# Patient Record
Sex: Male | Born: 1950 | Race: White | Hispanic: No | Smoking: Never smoker
Health system: Southern US, Community
[De-identification: ages and names within clinical notes are randomized; demographics above are authoritative.]

## PROBLEM LIST (undated history)

## (undated) DIAGNOSIS — R972 Elevated prostate specific antigen [PSA]: Secondary | ICD-10-CM

## (undated) DIAGNOSIS — E039 Hypothyroidism, unspecified: Secondary | ICD-10-CM

## (undated) DIAGNOSIS — E785 Hyperlipidemia, unspecified: Secondary | ICD-10-CM

## (undated) DIAGNOSIS — B351 Tinea unguium: Secondary | ICD-10-CM

## (undated) DIAGNOSIS — C61 Malignant neoplasm of prostate: Secondary | ICD-10-CM

## (undated) HISTORY — PX: COLONOSCOPY W/ BIOPSIES AND POLYPECTOMY: SHX1376

## (undated) HISTORY — DX: Malignant neoplasm of prostate: C61

## (undated) HISTORY — DX: Hypothyroidism, unspecified: E03.9

## (undated) HISTORY — DX: Hyperlipidemia, unspecified: E78.5

## (undated) HISTORY — DX: Tinea unguium: B35.1

---

## 2006-04-09 ENCOUNTER — Ambulatory Visit: Payer: Self-pay | Admitting: Internal Medicine

## 2006-04-09 LAB — CONVERTED CEMR LAB
ALT: 32 units/L (ref 0–40)
AST: 24 units/L (ref 0–37)
Albumin: 4.3 g/dL (ref 3.5–5.2)
Alkaline Phosphatase: 79 units/L (ref 39–117)
BUN: 11 mg/dL (ref 6–23)
CO2: 32 meq/L (ref 19–32)
Cholesterol: 189 mg/dL (ref 0–200)
Eosinophils Absolute: 0.1 10*3/uL (ref 0.0–0.6)
GFR calc non Af Amer: 74 mL/min
HCT: 43 % (ref 39.0–52.0)
HDL: 35.1 mg/dL — ABNORMAL LOW (ref >39.0)
Hemoglobin: 15.1 g/dL (ref 13.0–17.0)
Leukocytes, UA: NEGATIVE
Monocytes Relative: 5.8 % (ref 3.0–11.0)
Neutrophils Relative %: 67.2 % (ref 43.0–77.0)
Nitrite: NEGATIVE
PSA: 1.39 ng/mL (ref 0.10–4.00)
Potassium: 4.2 meq/L (ref 3.5–5.1)
RDW: 11.8 % (ref 11.5–14.6)
Total Bilirubin: 1.5 mg/dL — ABNORMAL HIGH (ref 0.3–1.2)
Total Protein: 6.5 g/dL (ref 6.0–8.3)
Urobilinogen, UA: 0.2 (ref 0.0–1.0)
WBC: 5.9 10*3/uL (ref 4.5–10.5)

## 2006-04-14 ENCOUNTER — Ambulatory Visit: Payer: Self-pay | Admitting: Internal Medicine

## 2007-05-10 ENCOUNTER — Encounter: Payer: Self-pay | Admitting: *Deleted

## 2007-05-10 DIAGNOSIS — B351 Tinea unguium: Secondary | ICD-10-CM | POA: Insufficient documentation

## 2007-05-10 HISTORY — DX: Tinea unguium: B35.1

## 2008-02-29 ENCOUNTER — Ambulatory Visit: Payer: Self-pay | Admitting: Gastroenterology

## 2008-03-05 ENCOUNTER — Telehealth: Payer: Self-pay | Admitting: Gastroenterology

## 2008-03-07 ENCOUNTER — Ambulatory Visit: Payer: Self-pay | Admitting: Gastroenterology

## 2008-03-07 ENCOUNTER — Encounter: Payer: Self-pay | Admitting: Internal Medicine

## 2008-03-14 ENCOUNTER — Encounter: Payer: Self-pay | Admitting: Gastroenterology

## 2009-11-05 ENCOUNTER — Ambulatory Visit: Payer: Self-pay | Admitting: Internal Medicine

## 2010-08-01 NOTE — Assessment & Plan Note (Signed)
[facility]                           PRIMARY CARE OFFICE NOTE   Zachary Duncan, Zachary Duncan                    MRN:          [id number]  DATE:04/14/2006                            DOB:          September 04, 1950    [name] is a 60 year old Caucasian gentleman last seen in 2000. He  presents today for followup evaluation and exam. In the interval, he  reports he has been very healthy. He did have a toenail fungus and was  seen at [facility] and treated with Lamisil with mixed  results. He otherwise reports he is feeling well and doing well with no  active medical problems.   PAST MEDICAL HISTORY:  SURGICAL:  None.  MEDICAL:  Usual childhood diseases. The patient also had blood  poisoning. No other major medica illnesses.   CURRENT MEDICATIONS:  None.   FAMILY HISTORY:  Father died of a MI in his 60s. Mother had a MI at age  2. The patient has a brother who died of heart disease. No family  history for colon cancer or prostate cancer, diabetes, hypertension.   SOCIAL HISTORY:  The patient has one year of college. He works in  Control and instrumentation engineer. The patient was married for 9 years, divorced - now  single. He has 1 son, age 76. The patient does have a significant other  who he has been seeing for 7 years in a monogamous relationship.   HABITS:  The patient is a very occasional social drinker, averaging  maybe 4 to 5 ounces of alcohol per month. No tobacco use.   The patient has no known drug allergies.   REVIEW OF SYSTEMS:  Negative for constitutional, cardiovascular,  respiratory, GI, or GU problems.   PHYSICAL EXAMINATION:  Temperature was 96.3, blood pressure 154/82,  pulse 52, weight 194, height 6 foot.  GENERAL APPEARANCE:  Well-developed, well-nourished gentleman in no  acute distress.  HEENT EXAM:  Normocephalic, atraumatic. EACs and TMs were unremarkable.  Oropharynx with native dentition in good repair. No buccal or palatal  lesions were noted. Posterior pharynx was clear. Conjunctivae and  sclerae were clear. PERRLA. EOMI. Funduscopic exam was unremarkable.  NECK:  Was supple without thyromegaly.  NODES:  No adenopathy was noted in the cervical or supraclavicular  regions.  CHEST:  With CVA tenderness.  LUNGS:  Clear to auscultation and percussion.  CARDIOVASCULAR:  2+ radial pulses. No JVD or carotid bruits. He has a  quiet precordium with a regular rate and rhythm without murmurs, rubs,  or gallops.  ABDOMEN:  Soft. No guarding or rebound. No organosplenomegaly was  appreciated.  GENITALIA:  Normal male phallus. Bilaterally distended testicles without  masses.  RECTAL EXAM:  Normal sphincter tone was noted. Prostate was smooth and  normal size and contour without nodules.  EXTREMITIES:  Without clubbing, cyanosis, edema or deformity.  NEUROLOGICAL EXAM:  Nonfocal.  SKIN:  Clear.   DATABASE:  Hemoglobin was 15.1 g, white count was 5900 with a normal  differential. Chemistries were unremarkable with a serum glucose of 103.  Kidney function normal with a creatinine of 1.1 and a GFR  of 74 mL per  minute. Liver functions were normal. Cholesterol was 189, triglycerides  were 94, HDL 35.1, LDL 135. TSH was minimally elevated at 6.77. PSA was  normal at 1.39. Urinalysis was negative.   ASSESSMENT AND PLAN:  Health maintenance. This is a pleasant gentleman  who seems to be medically stable at this time. He does have a  significant family history for heart disease but no other cardiac risk  factors. Would recommend he follow a low fat diet and exercise on a  regular basis to bring his LDL cholesterol to goal of 130 or less.   The patient is a candidate for colorectal cancer screening given his  age. He will be referred directly to GI for colonoscopy.   SUMMARY:  This is a pleasant gentleman who seems to be medically stable  at this time. He has no active medical problems. He is asked to return  to see me  in 2 years or on an as-needed basis.     Zachary Gess Norins, MD  Electronically Signed    MEN/MedQ  DD: 04/17/2006  DT: 04/17/2006  Job #: [id number]   cc:   Bunnie Domino

## 2010-10-16 ENCOUNTER — Encounter: Payer: Self-pay | Admitting: Internal Medicine

## 2010-10-16 ENCOUNTER — Other Ambulatory Visit (INDEPENDENT_AMBULATORY_CARE_PROVIDER_SITE_OTHER): Payer: BC Managed Care – PPO

## 2010-10-16 ENCOUNTER — Ambulatory Visit (INDEPENDENT_AMBULATORY_CARE_PROVIDER_SITE_OTHER): Payer: BC Managed Care – PPO | Admitting: Internal Medicine

## 2010-10-16 VITALS — BP 132/80 | HR 47 | Temp 97.4°F | Ht 71.0 in | Wt 189.4 lb

## 2010-10-16 DIAGNOSIS — M7989 Other specified soft tissue disorders: Secondary | ICD-10-CM

## 2010-10-16 DIAGNOSIS — M25449 Effusion, unspecified hand: Secondary | ICD-10-CM

## 2010-10-16 MED ORDER — PREDNISONE 10 MG PO TABS: 10.0000 mg | ORAL_TABLET | Freq: Every day | ORAL | Status: AC

## 2010-10-16 NOTE — Patient Instructions (Addendum)
Take all new medications as prescribed Continue all other medications as before Please go to LAB in the Basement for the blood and/or urine tests to be done today Please call the phone number [phone] (the PhoneTree System) for results of testing in 2-3 days;  When calling, simply dial the number, and when prompted enter the MRN number above (the Medical Record Number) and the # key, then the message should start.  

## 2010-10-16 NOTE — Assessment & Plan Note (Addendum)
Right hand, index finger MCP - ? Gout vs DJD vs tendonitis (doubt) vs other - will hold on films today, but tx with predpack trial, check uric acid, f/u next visit with Dr Debby Bud, consider hand surgury evaluatoin

## 2010-10-16 NOTE — Progress Notes (Signed)
Quick Note:  Voice message left on PhoneTree system - lab is negative, normal or otherwise stable, pt to continue same tx ______ 

## 2010-10-19 ENCOUNTER — Encounter: Payer: Self-pay | Admitting: Internal Medicine

## 2010-10-19 DIAGNOSIS — E785 Hyperlipidemia, unspecified: Secondary | ICD-10-CM | POA: Insufficient documentation

## 2010-10-19 NOTE — Progress Notes (Signed)
  Subjective:    Patient ID: Zachary Duncan, male    DOB: 12-Nov-1950, 60 y.o.   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   Pt denies fever, wt loss, night sweats, loss of appetite, or other constitutional symptoms Past Medical History  Diagnosis Date  . Dermatophytosis of nail 05/10/2007  . Hyperlipidemia    History reviewed. No pertinent past surgical history.  reports that he has never smoked. He has never used smokeless tobacco. He reports that he drinks alcohol. He reports that he does not use illicit drugs. family history includes Heart disease in his father and mother. No Known Allergies No current outpatient prescriptions on file prior to visit.   Review of Systems Review of Systems  Constitutional: Negative for diaphoresis and unexpected weight change.  HENT: Negative for drooling and tinnitus.   Eyes: Negative for photophobia and visual disturbance.  Respiratory: Negative for choking and stridor.         Objective:   Physical Exam BP 132/80  Pulse 47  Temp(Src) 97.4 F (36.3 C) (Oral)  Ht 5\' 11"  (1.803 m)  Wt 189 lb 6 oz (85.9 kg)  BMI 26.41 kg/m2  SpO2 98% Physical Exam  VS noted Constitutional: Pt appears well-developed and well-nourished.  HENT: Head: Normocephalic.  Right Ear: External ear normal.  Left Ear: External ear normal.  Eyes: Conjunctivae and EOM are normal. Pupils are equal, round, and reactive to light.  Neck: Normal range of motion. Neck supple.  Cardiovascular: Normal rate and regular rhythm.   Pulmonary/Chest: Effort normal and breath sounds normal.   Neurological: Pt is alert. No cranial nerve deficit. motor intact to UE's Skin: Skin is warm. No erythema.  Psychiatric: Pt behavior is normal. Thought content normal.  Right hand first MCP with 1-2+ effusion with first finger swelling associated, no erythema, but mild tender and decreased ROM of MCP and finger DIP/PIP       Assessment & Plan:

## 2012-12-26 ENCOUNTER — Encounter: Payer: Self-pay | Admitting: Gastroenterology

## 2013-08-12 ENCOUNTER — Encounter: Payer: Self-pay | Admitting: Gastroenterology

## 2014-09-26 ENCOUNTER — Encounter: Payer: Self-pay | Admitting: Family

## 2014-09-26 ENCOUNTER — Telehealth: Payer: Self-pay | Admitting: Family

## 2014-09-26 ENCOUNTER — Other Ambulatory Visit (INDEPENDENT_AMBULATORY_CARE_PROVIDER_SITE_OTHER): Payer: BLUE CROSS/BLUE SHIELD

## 2014-09-26 ENCOUNTER — Ambulatory Visit (INDEPENDENT_AMBULATORY_CARE_PROVIDER_SITE_OTHER): Payer: BLUE CROSS/BLUE SHIELD | Admitting: Family

## 2014-09-26 VITALS — BP 162/94 | HR 59 | Temp 98.1°F | Resp 18 | Ht 70.5 in | Wt 185.4 lb

## 2014-09-26 DIAGNOSIS — Z0000 Encounter for general adult medical examination without abnormal findings: Secondary | ICD-10-CM | POA: Insufficient documentation

## 2014-09-26 DIAGNOSIS — R7989 Other specified abnormal findings of blood chemistry: Secondary | ICD-10-CM

## 2014-09-26 LAB — COMPREHENSIVE METABOLIC PANEL
ALT: 15 U/L (ref 0–53)
AST: 16 U/L (ref 0–37)
Albumin: 4.5 g/dL (ref 3.5–5.2)
Alkaline Phosphatase: 84 U/L (ref 39–117)
BILIRUBIN TOTAL: 1 mg/dL (ref 0.2–1.2)
BUN: 13 mg/dL (ref 6–23)
CO2: 27 mEq/L (ref 19–32)
Calcium: 9.4 mg/dL (ref 8.4–10.5)
Chloride: 107 mEq/L (ref 96–112)
Creatinine, Ser: 1.03 mg/dL (ref 0.40–1.50)
GFR: 77.3 mL/min (ref >60.00)
Glucose, Bld: 95 mg/dL (ref 70–99)
Potassium: 4.1 mEq/L (ref 3.5–5.1)
SODIUM: 143 meq/L (ref 135–145)
TOTAL PROTEIN: 6.9 g/dL (ref 6.0–8.3)

## 2014-09-26 LAB — CBC
HCT: 42.9 % (ref 39.0–52.0)
HEMOGLOBIN: 14.7 g/dL (ref 13.0–17.0)
MCHC: 34.3 g/dL (ref 30.0–36.0)
MCV: 91.5 fl (ref 78.0–100.0)
Platelets: 178 10*3/uL (ref 150.0–400.0)
RBC: 4.69 Mil/uL (ref 4.22–5.81)
RDW: 12.8 % (ref 11.5–15.5)
WBC: 6 10*3/uL (ref 4.0–10.5)

## 2014-09-26 LAB — LIPID PANEL
CHOL/HDL RATIO: 4
CHOLESTEROL: 168 mg/dL (ref 0–200)
HDL: 43 mg/dL (ref >39.00)
LDL Cholesterol: 113 mg/dL — ABNORMAL HIGH (ref 0–99)
NONHDL: 125
Triglycerides: 62 mg/dL (ref 0.0–149.0)
VLDL: 12.4 mg/dL (ref 0.0–40.0)

## 2014-09-26 LAB — PSA: PSA: 2.21 ng/mL (ref 0.10–4.00)

## 2014-09-26 LAB — TSH: TSH: 9.81 u[IU]/mL — AB (ref 0.35–4.50)

## 2014-09-26 NOTE — Assessment & Plan Note (Signed)
1) Anticipatory Guidance: Discussed importance of wearing a seatbelt while driving and not texting while driving; changing batteries in smoke detector at least once annually; wearing suntan lotion when outside; eating a balanced and moderate diet; getting physical activity at least 30 minutes per day.  2) Immunizations / Screenings / Labs:  Declines Zostavax. All other immunizations are up-to-date per recommendations. Due for a vision screen which will be scheduled independently. All other screenings are up-to-date per recommendations. Obtain CBC, CMET, Lipid profile and TSH.   Overall well exam. Patient has few cardiovascular risk factors at this time. His blood pressure slightly elevated today. Continue to monitor at this time and managed with lifestyle management. Recommended increasing physical activity to 30 minutes most days of the week or 10,000 steps per day at work. Follow-up prevention exam in 1 year. Follow-up office visit pending lab work.

## 2014-09-26 NOTE — Progress Notes (Signed)
Subjective:    Patient ID: Zachary Duncan, male    DOB: June 04, 1950, 64 y.o.   MRN: [id number]  Chief Complaint  Patient presents with  . Establish Care    CPE, fasting     HPI:  Zachary Duncan is a 64 y.o. male who presents today for an annual wellness visit.   1) Health Maintenance -   Diet - Averages 3 meals per day consisting of whole grains, chicken, vegetables, and occasional fruits. Denies caffeine intake.  Exercise - Walking around at work about 3 miles per day.    2) Preventative Exams / Immunizations:  Dental -- Up to date  Vision -- Due for exam    Health Maintenance  Topic Date Due  . HIV Screening  10/29/1965  . ZOSTAVAX  10/30/2010  . TETANUS/TDAP  09/26/2015 (Originally 10/29/1969)  . INFLUENZA VACCINE  10/15/2014  . COLONOSCOPY  03/07/2018  Declines Zostavax   There is no immunization history on file for this patient.  No Known Allergies   No outpatient prescriptions prior to visit.   No facility-administered medications prior to visit.     Past Medical History  Diagnosis Date  . Dermatophytosis of nail 05/10/2007  . Hyperlipidemia      History reviewed. No pertinent past surgical history.   Family History  Problem Relation Age of Onset  . Heart disease Mother   . Heart disease Father      History   Social History  . Marital Status: Single    Spouse Name: N/A  . Number of Children: 1  . Years of Education: 14   Occupational History  . [employer] Consultant[employer]    Social History Main Topics  . Smoking status: Never Smoker   . Smokeless tobacco: Never Used  . Alcohol Use: Yes     Comment: Socially  . Drug Use: No  . Sexual Activity: Not on file   Other Topics Concern  . Not on file   Social History Narrative   Fun: Tennis   Denies religious beliefs effecting health care.      Review of Systems  Constitutional: Denies fever, chills, fatigue, or significant weight gain/loss. HENT: Head: Denies headache or neck  pain Ears: Denies changes in hearing, ringing in ears, earache, drainage Nose: Denies discharge, stuffiness, itching, nosebleed, sinus pain Throat: Denies sore throat, hoarseness, dry mouth, sores, thrush Eyes: Denies loss/changes in vision, pain, redness, blurry/double vision, flashing lights Cardiovascular: Denies chest pain/discomfort, tightness, palpitations, shortness of breath with activity, difficulty lying down, swelling, sudden awakening with shortness of breath Respiratory: Denies shortness of breath, cough, sputum production, wheezing Gastrointestinal: Denies dysphasia, heartburn, change in appetite, nausea, change in bowel habits, rectal bleeding, constipation, diarrhea, yellow skin or eyes Genitourinary: Denies frequency, urgency, burning/pain, blood in urine, incontinence, change in urinary strength. Musculoskeletal: Denies muscle/joint pain, stiffness, back pain, redness or swelling of joints, trauma Skin: Denies rashes, lumps, itching, dryness, color changes, or hair/nail changes Toenail fungus Neurological: Denies dizziness, fainting, seizures, weakness, numbness, tingling, tremor Psychiatric - Denies nervousness, stress, depression or memory loss Endocrine: Denies heat or cold intolerance, sweating, frequent urination, excessive thirst, changes in appetite Hematologic: Denies ease of bruising or bleeding     Objective:    BP 162/94 mmHg  Pulse 59  Temp(Src) 98.1 F (36.7 C) (Oral)  Resp 18  Ht 5' 10.5" (1.791 m)  Wt 185 lb 6.4 oz (84.097 kg)  BMI 26.22 kg/m2  SpO2 97% Nursing note and vital signs reviewed.  Physical  Exam  Constitutional: He is oriented to person, place, and time. He appears well-developed and well-nourished.  HENT:  Head: Normocephalic.  Right Ear: Hearing, tympanic membrane, external ear and ear canal normal.  Left Ear: Hearing, tympanic membrane, external ear and ear canal normal.  Nose: Nose normal.  Mouth/Throat: Uvula is midline,  oropharynx is clear and moist and mucous membranes are normal.  Eyes: Conjunctivae and EOM are normal. Pupils are equal, round, and reactive to light.  Neck: Neck supple. No JVD present. No tracheal deviation present. No thyromegaly present.  Cardiovascular: Normal rate, regular rhythm, normal heart sounds and intact distal pulses.   Pulmonary/Chest: Effort normal and breath sounds normal.  Abdominal: Soft. Bowel sounds are normal. He exhibits no distension and no mass. There is no tenderness. There is no rebound and no guarding.  Musculoskeletal: Normal range of motion. He exhibits no edema or tenderness.  Lymphadenopathy:    He has no cervical adenopathy.  Neurological: He is alert and oriented to person, place, and time. He has normal reflexes. No cranial nerve deficit. He exhibits normal muscle tone. Coordination normal.  Skin: Skin is warm and dry.  Psychiatric: He has a normal mood and affect. His behavior is normal. Judgment and thought content normal.       Assessment & Plan:   Problem List Items Addressed This Visit      Other   Routine general medical examination at a health care facility - Primary    1) Anticipatory Guidance: Discussed importance of wearing a seatbelt while driving and not texting while driving; changing batteries in smoke detector at least once annually; wearing suntan lotion when outside; eating a balanced and moderate diet; getting physical activity at least 30 minutes per day.  2) Immunizations / Screenings / Labs:  Declines Zostavax. All other immunizations are up-to-date per recommendations. Due for a vision screen which will be scheduled independently. All other screenings are up-to-date per recommendations. Obtain CBC, CMET, Lipid profile and TSH.   Overall well exam. Patient has few cardiovascular risk factors at this time. His blood pressure slightly elevated today. Continue to monitor at this time and managed with lifestyle management. Recommended  increasing physical activity to 30 minutes most days of the week or 10,000 steps per day at work. Follow-up prevention exam in 1 year. Follow-up office visit pending lab work.      Relevant Orders   Comprehensive metabolic panel (Completed)   CBC (Completed)   Lipid panel (Completed)   TSH (Completed)   PSA (Completed)

## 2014-09-26 NOTE — Telephone Encounter (Signed)
Please inform patient that his blood work shows that his kidney function, electrolyte function, liver function, white/red blood cells, and prostate are all within the normal limits. His thyroid function is noted to be increased and I would like him to repeat his thyroid levels in 1 week. This is a nonfasting testing be completed at any time during the day. If confirmed, we may recommend starting thyroid medication. His cholesterol is also slightly elevated, however no medication is needed at this time. Please have him follow-up with blood work in one week.

## 2014-09-26 NOTE — Patient Instructions (Signed)
Thank you for choosing [facility].  Summary/Instructions:  Your prescription(s) have been submitted to your pharmacy or been printed and provided for you. Please take as directed and contact our office if you believe you are having problem(s) with the medication(s) or have any questions.  Please stop by the lab on the basement level of the building for your blood work. Your results will be released to [facility] (or called to you) after review, usually within 72 hours after test completion. If any changes need to be made, you will be notified at that same time.  Health Maintenance A healthy lifestyle and preventative care can promote health and wellness.  Maintain regular health, dental, and eye exams.  Eat a healthy diet. Foods like vegetables, fruits, whole grains, low-fat dairy products, and lean protein foods contain the nutrients you need and are low in calories. Decrease your intake of foods high in solid fats, added sugars, and salt. Get information about a proper diet from your health care provider, if necessary.  Regular physical exercise is one of the most important things you can do for your health. Most adults should get at least 150 minutes of moderate-intensity exercise (any activity that increases your heart rate and causes you to sweat) each week. In addition, most adults need muscle-strengthening exercises on 2 or more days a week.   Maintain a healthy weight. The body mass index (BMI) is a screening tool to identify possible weight problems. It provides an estimate of body fat based on height and weight. Your health care provider can find your BMI and can help you achieve or maintain a healthy weight. For males 20 years and older:  A BMI below 18.5 is considered underweight.  A BMI of 18.5 to 24.9 is normal.  A BMI of 25 to 29.9 is considered overweight.  A BMI of 30 and above is considered obese.  Maintain normal blood lipids and cholesterol by exercising and  minimizing your intake of saturated fat. Eat a balanced diet with plenty of fruits and vegetables. Blood tests for lipids and cholesterol should begin at age 19 and be repeated every 5 years. If your lipid or cholesterol levels are high, you are over age 34, or you are at high risk for heart disease, you may need your cholesterol levels checked more frequently.Ongoing high lipid and cholesterol levels should be treated with medicines if diet and exercise are not working.  If you smoke, find out from your health care provider how to quit. If you do not use tobacco, do not start.  Lung cancer screening is recommended for adults aged 34-80 years who are at high risk for developing lung cancer because of a history of smoking. A yearly low-dose CT scan of the lungs is recommended for people who have at least a 30-pack-year history of smoking and are current smokers or have quit within the past 15 years. A pack year of smoking is smoking an average of 1 pack of cigarettes a day for 1 year (for example, a 30-pack-year history of smoking could mean smoking 1 pack a day for 30 years or 2 packs a day for 15 years). Yearly screening should continue until the smoker has stopped smoking for at least 15 years. Yearly screening should be stopped for people who develop a health problem that would prevent them from having lung cancer treatment.  If you choose to drink alcohol, do not have more than 2 drinks per day. One drink is considered to be 12  oz (360 mL) of beer, 5 oz (150 mL) of wine, or 1.5 oz (45 mL) of liquor.  Avoid the use of street drugs. Do not share needles with anyone. Ask for help if you need support or instructions about stopping the use of drugs.  High blood pressure causes heart disease and increases the risk of stroke. Blood pressure should be checked at least every 1-2 years. Ongoing high blood pressure should be treated with medicines if weight loss and exercise are not effective.  If you are  26-15 years old, ask your health care provider if you should take aspirin to prevent heart disease.  Diabetes screening involves taking a blood sample to check your fasting blood sugar level. This should be done once every 3 years after age 37 if you are at a normal weight and without risk factors for diabetes. Testing should be considered at a younger age or be carried out more frequently if you are overweight and have at least 1 risk factor for diabetes.  Colorectal cancer can be detected and often prevented. Most routine colorectal cancer screening begins at the age of 63 and continues through age 32. However, your health care provider may recommend screening at an earlier age if you have risk factors for colon cancer. On a yearly basis, your health care provider may provide home test kits to check for hidden blood in the stool. A small camera at the end of a tube may be used to directly examine the colon (sigmoidoscopy or colonoscopy) to detect the earliest forms of colorectal cancer. Talk to your health care provider about this at age 83 when routine screening begins. A direct exam of the colon should be repeated every 5-10 years through age 70, unless early forms of precancerous polyps or small growths are found.  People who are at an increased risk for hepatitis B should be screened for this virus. You are considered at high risk for hepatitis B if:  You were born in a country where hepatitis B occurs often. Talk with your health care provider about which countries are considered high risk.  Your parents were born in a high-risk country and you have not received a shot to protect against hepatitis B (hepatitis B vaccine).  You have HIV or AIDS.  You use needles to inject street drugs.  You live with, or have sex with, someone who has hepatitis B.  You are a man who has sex with other men (MSM).  You get hemodialysis treatment.  You take certain medicines for conditions like cancer, organ  transplantation, and autoimmune conditions.  Hepatitis C blood testing is recommended for all people born from [birth year] through 1965 and any individual with known risk factors for hepatitis C.  Healthy men should no longer receive prostate-specific antigen (PSA) blood tests as part of routine cancer screening. Talk to your health care provider about prostate cancer screening.  Testicular cancer screening is not recommended for adolescents or adult males who have no symptoms. Screening includes self-exam, a health care provider exam, and other screening tests. Consult with your health care provider about any symptoms you have or any concerns you have about testicular cancer.  Practice safe sex. Use condoms and avoid high-risk sexual practices to reduce the spread of sexually transmitted infections (STIs).  You should be screened for STIs, including gonorrhea and chlamydia if:  You are sexually active and are younger than 24 years.  You are older than 24 years, and your health care provider tells  you that you are at risk for this type of infection.  Your sexual activity has changed since you were last screened, and you are at an increased risk for chlamydia or gonorrhea. Ask your health care provider if you are at risk.  If you are at risk of being infected with HIV, it is recommended that you take a prescription medicine daily to prevent HIV infection. This is called pre-exposure prophylaxis (PrEP). You are considered at risk if:  You are a man who has sex with other men (MSM).  You are a heterosexual man who is sexually active with multiple partners.  You take drugs by injection.  You are sexually active with a partner who has HIV.  Talk with your health care provider about whether you are at high risk of being infected with HIV. If you choose to begin PrEP, you should first be tested for HIV. You should then be tested every 3 months for as long as you are taking PrEP.  Use sunscreen. Apply  sunscreen liberally and repeatedly throughout the day. You should seek shade when your shadow is shorter than you. Protect yourself by wearing long sleeves, pants, a wide-brimmed hat, and sunglasses year round whenever you are outdoors.  Tell your health care provider of new moles or changes in moles, especially if there is a change in shape or color. Also, tell your health care provider if a mole is larger than the size of a pencil eraser.  A one-time screening for abdominal aortic aneurysm (AAA) and surgical repair of large AAAs by ultrasound is recommended for men aged 21-75 years who are current or former smokers.  Stay current with your vaccines (immunizations). Document Released: 08/29/2007 Document Revised: 03/07/2013 Document Reviewed: 07/28/2010 [facility] Patient Information 2015 [place], [place]. This information is not intended to replace advice given to you by your health care provider. Make sure you discuss any questions you have with your health care provider.

## 2014-09-26 NOTE — Progress Notes (Signed)
Pre visit review using our clinic review tool, if applicable. No additional management support is needed unless otherwise documented below in the visit note. 

## 2014-09-27 NOTE — Telephone Encounter (Signed)
Pt aware of results. Requested that lab work be sent to [facility].

## 2015-04-17 ENCOUNTER — Encounter: Payer: Self-pay | Admitting: Gastroenterology

## 2016-02-18 ENCOUNTER — Telehealth: Payer: Self-pay | Admitting: Family

## 2016-02-18 DIAGNOSIS — Z0000 Encounter for general adult medical examination without abnormal findings: Secondary | ICD-10-CM

## 2016-02-18 NOTE — Telephone Encounter (Signed)
Please advise 

## 2016-02-18 NOTE — Telephone Encounter (Signed)
Patient is scheduled for upcoming CPE.  Patient is requesting labs to be entered prior to appt.  Please follow up in regard.

## 2016-02-19 NOTE — Telephone Encounter (Signed)
Please determine what insurance the patient has as if he is transitioning to medicare different orders need to be placed.

## 2016-02-20 NOTE — Addendum Note (Signed)
Addended by: Mauricio Po D on: 02/20/2016 11:21 PM   Modules accepted: Orders

## 2016-02-20 NOTE — Telephone Encounter (Signed)
Lab orders placed.  

## 2016-02-20 NOTE — Telephone Encounter (Signed)
Patient states he will still have the same insurance.  Notified patient he can go to lab early.

## 2016-02-21 NOTE — Telephone Encounter (Signed)
LVM for pt letting him know orders have been placed.

## 2016-02-27 ENCOUNTER — Encounter: Payer: BLUE CROSS/BLUE SHIELD | Admitting: Family

## 2016-06-29 ENCOUNTER — Encounter: Payer: Self-pay | Admitting: Gastroenterology

## 2016-06-29 ENCOUNTER — Telehealth: Payer: Self-pay | Admitting: Family

## 2016-06-29 NOTE — Telephone Encounter (Addendum)
Patient requesting routine referral for colonoscopy to be sent to GI.   In looking at chart review, patient does not look due for routine referral.  Patient did states he had polyps found on his last colonoscopy and wanted to know if he needed to be seen sooner.  I did transfer patient to GI to get set up and go from there.

## 2016-07-15 ENCOUNTER — Encounter: Payer: Self-pay | Admitting: Family

## 2016-07-15 ENCOUNTER — Other Ambulatory Visit (INDEPENDENT_AMBULATORY_CARE_PROVIDER_SITE_OTHER): Payer: BLUE CROSS/BLUE SHIELD

## 2016-07-15 ENCOUNTER — Ambulatory Visit (INDEPENDENT_AMBULATORY_CARE_PROVIDER_SITE_OTHER): Payer: BLUE CROSS/BLUE SHIELD | Admitting: Family

## 2016-07-15 VITALS — BP 134/80 | HR 60 | Temp 98.5°F | Resp 16 | Ht 70.5 in | Wt 192.4 lb

## 2016-07-15 DIAGNOSIS — R7989 Other specified abnormal findings of blood chemistry: Secondary | ICD-10-CM | POA: Insufficient documentation

## 2016-07-15 DIAGNOSIS — Z0000 Encounter for general adult medical examination without abnormal findings: Secondary | ICD-10-CM

## 2016-07-15 DIAGNOSIS — R946 Abnormal results of thyroid function studies: Secondary | ICD-10-CM

## 2016-07-15 DIAGNOSIS — E782 Mixed hyperlipidemia: Secondary | ICD-10-CM

## 2016-07-15 LAB — LIPID PANEL
Cholesterol: 200 mg/dL (ref 0–200)
HDL: 42.2 mg/dL (ref >39.00)
NONHDL: 158.02
Total CHOL/HDL Ratio: 5
Triglycerides: 205 mg/dL — ABNORMAL HIGH (ref 0.0–149.0)
VLDL: 41 mg/dL — ABNORMAL HIGH (ref 0.0–40.0)

## 2016-07-15 LAB — CBC
HCT: 43.9 % (ref 39.0–52.0)
HEMOGLOBIN: 15.1 g/dL (ref 13.0–17.0)
MCHC: 34.4 g/dL (ref 30.0–36.0)
MCV: 92.4 fl (ref 78.0–100.0)
PLATELETS: 206 10*3/uL (ref 150.0–400.0)
RBC: 4.75 Mil/uL (ref 4.22–5.81)
RDW: 12.9 % (ref 11.5–15.5)
WBC: 6.9 10*3/uL (ref 4.0–10.5)

## 2016-07-15 LAB — COMPREHENSIVE METABOLIC PANEL
ALK PHOS: 91 U/L (ref 39–117)
ALT: 19 U/L (ref 0–53)
AST: 15 U/L (ref 0–37)
Albumin: 4.7 g/dL (ref 3.5–5.2)
BILIRUBIN TOTAL: 1.9 mg/dL — AB (ref 0.2–1.2)
BUN: 17 mg/dL (ref 6–23)
CALCIUM: 9.7 mg/dL (ref 8.4–10.5)
CO2: 30 meq/L (ref 19–32)
CREATININE: 1.05 mg/dL (ref 0.40–1.50)
Chloride: 105 mEq/L (ref 96–112)
GFR: 75.18 mL/min (ref >60.00)
GLUCOSE: 97 mg/dL (ref 70–99)
Potassium: 4.2 mEq/L (ref 3.5–5.1)
Sodium: 141 mEq/L (ref 135–145)
TOTAL PROTEIN: 6.8 g/dL (ref 6.0–8.3)

## 2016-07-15 LAB — LDL CHOLESTEROL, DIRECT: LDL DIRECT: 130 mg/dL

## 2016-07-15 LAB — TSH: TSH: 11.24 u[IU]/mL — ABNORMAL HIGH (ref 0.35–4.50)

## 2016-07-15 LAB — T4, FREE: FREE T4: 0.68 ng/dL (ref 0.60–1.60)

## 2016-07-15 LAB — PSA: PSA: 3.35 ng/mL (ref 0.10–4.00)

## 2016-07-15 NOTE — Assessment & Plan Note (Signed)
Not currently maintained on medication. Obtain lipid profile. Continue lifestyle management pending lipid profile results.

## 2016-07-15 NOTE — Assessment & Plan Note (Addendum)
1) Anticipatory Guidance: Discussed importance of wearing a seatbelt while driving and not texting while driving; changing batteries in smoke detector at least once annually; wearing suntan lotion when outside; eating a balanced and moderate diet; getting physical activity at least 30 minutes per day.  2) Immunizations / Screenings / Labs:  Declines tetanus and pneumococcal. All other immunizations are up-to-date per recommendations. Obtain hepatitis C antibody for hepatitis C screening. Obtain prostate specific antigen for prostate cancer screening. Colon cancer screening is up-to-date with appointment scheduled. All other screenings are up-to-date per recommendations. Obtain CBC, CMET, and lipid profile.    Overall well exam with risk factors for cardiovascular disease including hyperlipidemia. He is of good weight and exercises regularly through work. Risk factors will be monitored through blood work. Encouraged to continue current healthy lifestyle behaviors and choices. Follow-up prevention exam in 1 year. Up office visit for chronic conditions pending blood work as necessary.

## 2016-07-15 NOTE — Assessment & Plan Note (Signed)
Previously noted to have elevated TSH with repeat testing not performed. Obtain TSH and T4 for follow-up. Continue with lifestyle management pending TSH results.

## 2016-07-15 NOTE — Patient Instructions (Addendum)
Thank you for choosing [facility].  SUMMARY AND INSTRUCTIONS:  Please continue with the physical activity.   Continue a nutritional intake that is moderate, balanced and varied.   We will follow up on your thyroid.   Debrox or Murine for ear cleaning.    Medication:  Your prescription(s) have been submitted to your pharmacy or been printed and provided for you. Please take as directed and contact our office if you believe you are having problem(s) with the medication(s) or have any questions.  Labs:  Please stop by the lab on the lower level of the building for your blood work. Your results will be released to [facility] (or called to you) after review, usually within 72 hours after test completion. If any changes need to be made, you will be notified at that same time.  1.) The lab is open from 7:30am to 5:30 pm Monday-Friday 2.) No appointment is necessary 3.) Fasting (if needed) is 6-8 hours after food and drink; black coffee and water are okay   Follow up:  If your symptoms worsen or fail to improve, please contact our office for further instruction, or in case of emergency go directly to the emergency room at the closest medical facility.     Health Maintenance, Male A healthy lifestyle and preventive care is important for your health and wellness. Ask your health care provider about what schedule of regular examinations is right for you. What should I know about weight and diet?  Eat a Healthy Diet  Eat plenty of vegetables, fruits, whole grains, low-fat dairy products, and lean protein.  Do not eat a lot of foods high in solid fats, added sugars, or salt. Maintain a Healthy Weight  Regular exercise can help you achieve or maintain a healthy weight. You should:  Do at least 150 minutes of exercise each week. The exercise should increase your heart rate and make you sweat (moderate-intensity exercise).  Do strength-training exercises at least twice a week. Watch  Your Levels of Cholesterol and Blood Lipids  Have your blood tested for lipids and cholesterol every 5 years starting at 66 years of age. If you are at high risk for heart disease, you should start having your blood tested when you are 66 years old. You may need to have your cholesterol levels checked more often if:  Your lipid or cholesterol levels are high.  You are older than 66 years of age.  You are at high risk for heart disease. What should I know about cancer screening? Many types of cancers can be detected early and may often be prevented. Lung Cancer  You should be screened every year for lung cancer if:  You are a current smoker who has smoked for at least 30 years.  You are a former smoker who has quit within the past 15 years.  Talk to your health care provider about your screening options, when you should start screening, and how often you should be screened. Colorectal Cancer  Routine colorectal cancer screening usually begins at 66 years of age and should be repeated every 5-10 years until you are 66 years old. You may need to be screened more often if early forms of precancerous polyps or small growths are found. Your health care provider may recommend screening at an earlier age if you have risk factors for colon cancer.  Your health care provider may recommend using home test kits to check for hidden blood in the stool.  A small camera at  the end of a tube can be used to examine your colon (sigmoidoscopy or colonoscopy). This checks for the earliest forms of colorectal cancer. Prostate and Testicular Cancer  Depending on your age and overall health, your health care provider may do certain tests to screen for prostate and testicular cancer.  Talk to your health care provider about any symptoms or concerns you have about testicular or prostate cancer. Skin Cancer  Check your skin from head to toe regularly.  Tell your health care provider about any new moles or  changes in moles, especially if:  There is a change in a mole's size, shape, or color.  You have a mole that is larger than a pencil eraser.  Always use sunscreen. Apply sunscreen liberally and repeat throughout the day.  Protect yourself by wearing long sleeves, pants, a wide-brimmed hat, and sunglasses when outside. What should I know about heart disease, diabetes, and high blood pressure?  If you are 7-61 years of age, have your blood pressure checked every 3-5 years. If you are 76 years of age or older, have your blood pressure checked every year. You should have your blood pressure measured twice-once when you are at a hospital or clinic, and once when you are not at a hospital or clinic. Record the average of the two measurements. To check your blood pressure when you are not at a hospital or clinic, you can use:  An automated blood pressure machine at a pharmacy.  A home blood pressure monitor.  Talk to your health care provider about your target blood pressure.  If you are between 73-72 years old, ask your health care provider if you should take aspirin to prevent heart disease.  Have regular diabetes screenings by checking your fasting blood sugar level.  If you are at a normal weight and have a low risk for diabetes, have this test once every three years after the age of 12.  If you are overweight and have a high risk for diabetes, consider being tested at a younger age or more often.  A one-time screening for abdominal aortic aneurysm (AAA) by ultrasound is recommended for men aged 36-75 years who are current or former smokers. What should I know about preventing infection? Hepatitis B  If you have a higher risk for hepatitis B, you should be screened for this virus. Talk with your health care provider to find out if you are at risk for hepatitis B infection. Hepatitis C  Blood testing is recommended for:  Everyone born from [birth year] through 1965.  Anyone with known risk  factors for hepatitis C. Sexually Transmitted Diseases (STDs)  You should be screened each year for STDs including gonorrhea and chlamydia if:  You are sexually active and are younger than 66 years of age.  You are older than 66 years of age and your health care provider tells you that you are at risk for this type of infection.  Your sexual activity has changed since you were last screened and you are at an increased risk for chlamydia or gonorrhea. Ask your health care provider if you are at risk.  Talk with your health care provider about whether you are at high risk of being infected with HIV. Your health care provider may recommend a prescription medicine to help prevent HIV infection. What else can I do?  Schedule regular health, dental, and eye exams.  Stay current with your vaccines (immunizations).  Do not use any tobacco products, such as cigarettes, chewing  tobacco, and e-cigarettes. If you need help quitting, ask your health care provider.  Limit alcohol intake to no more than 2 drinks per day. One drink equals 12 ounces of beer, 5 ounces of wine, or 1 ounces of hard liquor.  Do not use street drugs.  Do not share needles.  Ask your health care provider for help if you need support or information about quitting drugs.  Tell your health care provider if you often feel depressed.  Tell your health care provider if you have ever been abused or do not feel safe at home. This information is not intended to replace advice given to you by your health care provider. Make sure you discuss any questions you have with your health care provider. Document Released: 08/29/2007 Document Revised: 10/30/2015 Document Reviewed: 12/04/2014 Elsevier Interactive Patient Education  2017 [employer].

## 2016-07-15 NOTE — Progress Notes (Signed)
Subjective:    Patient ID: Zachary Duncan, male    DOB: May 17, 1950, 66 y.o.   MRN: [id number]  Chief Complaint  Patient presents with  . CPE    fasting    HPI:  Zachary Duncan is a 66 y.o. male who presents today for an annual wellness visit.   1) Health Maintenance -   Diet -  Averaging about 3 meals per day consisting of a regular diet; 1-2 cups of caffeine on occasion.   Exercise - Walking around work about 3-5 miles per day; no structured exercise outside of work    2) Publishing rights manager / Immunizations:  Dental -- Up to date   Vision -- Up to date   Health Maintenance  Topic Date Due  . Hepatitis C Screening  05/13/1950  . HIV Screening  10/29/1965  . TETANUS/TDAP  07/14/2017 (Originally 10/29/1969)  . PNA vac Low Risk Adult (1 of 2 - PCV13) 07/14/2017 (Originally 10/30/2015)  . INFLUENZA VACCINE  10/14/2016  . COLONOSCOPY  03/07/2018     There is no immunization history on file for this patient.   No Known Allergies   No outpatient prescriptions prior to visit.   No facility-administered medications prior to visit.      Past Medical History:  Diagnosis Date  . Dermatophytosis of nail 05/10/2007  . Hyperlipidemia      History reviewed. No pertinent surgical history.   Family History  Problem Relation Age of Onset  . Heart disease Mother   . Heart disease Father      Social History   Social History  . Marital status: Single    Spouse name: N/A  . Number of children: 1  . Years of education: 69   Occupational History  . [employer]    Social History Main Topics  . Smoking status: Never Smoker  . Smokeless tobacco: Never Used  . Alcohol use Yes     Comment: Rarely  . Drug use: No  . Sexual activity: Not on file   Other Topics Concern  . Not on file   Social History Narrative   Fun: Tennis when able    Denies religious beliefs effecting health care.       Review of Systems  Constitutional: Denies fever,  chills, fatigue, or significant weight gain/loss. HENT: Head: Denies headache or neck pain Ears: Denies changes in hearing, ringing in ears, earache, drainage Nose: Denies discharge, stuffiness, itching, nosebleed, sinus pain Throat: Denies sore throat, hoarseness, dry mouth, sores, thrush Eyes: Denies loss/changes in vision, pain, redness, blurry/double vision, flashing lights Cardiovascular: Denies chest pain/discomfort, tightness, palpitations, shortness of breath with activity, difficulty lying down, swelling, sudden awakening with shortness of breath Respiratory: Denies shortness of breath, cough, sputum production, wheezing Gastrointestinal: Denies dysphasia, heartburn, change in appetite, nausea, change in bowel habits, rectal bleeding, constipation, diarrhea, yellow skin or eyes Genitourinary: Denies frequency, urgency, burning/pain, blood in urine, incontinence, change in urinary strength. Musculoskeletal: Denies muscle/joint pain, stiffness, back pain, redness or swelling of joints, trauma Skin: Denies rashes, lumps, itching, dryness, color changes, or hair/nail changes Neurological: Denies dizziness, fainting, seizures, weakness, numbness, tingling, tremor Psychiatric - Denies nervousness, stress, depression or memory loss Endocrine: Denies heat or cold intolerance, sweating, frequent urination, excessive thirst, changes in appetite Hematologic: Denies ease of bruising or bleeding     Objective:     BP 134/80 (BP Location: Left Arm, Patient Position: Sitting, Cuff Size: Large)   Pulse 60   Temp 98.5 F (  36.9 C) (Oral)   Resp 16   Ht 5' 10.5" (1.791 m)   Wt 192 lb 6.4 oz (87.3 kg)   SpO2 98%   BMI 27.22 kg/m  Nursing note and vital signs reviewed.  Physical Exam  Constitutional: He is oriented to person, place, and time. He appears well-developed and well-nourished.  HENT:  Head: Normocephalic.  Right Ear: Hearing, tympanic membrane, external ear and ear canal normal.    Left Ear: Hearing, tympanic membrane, external ear and ear canal normal.  Nose: Nose normal.  Mouth/Throat: Uvula is midline, oropharynx is clear and moist and mucous membranes are normal.  Eyes: Conjunctivae and EOM are normal. Pupils are equal, round, and reactive to light.  Neck: Neck supple. No JVD present. No tracheal deviation present. No thyromegaly present.  Cardiovascular: Normal rate, regular rhythm, normal heart sounds and intact distal pulses.   Pulmonary/Chest: Effort normal and breath sounds normal.  Abdominal: Soft. Bowel sounds are normal. He exhibits no distension and no mass. There is no tenderness. There is no rebound and no guarding.  Musculoskeletal: Normal range of motion. He exhibits no edema or tenderness.  Lymphadenopathy:    He has no cervical adenopathy.  Neurological: He is alert and oriented to person, place, and time. He has normal reflexes. No cranial nerve deficit. He exhibits normal muscle tone. Coordination normal.  Skin: Skin is warm and dry.  Psychiatric: He has a normal mood and affect. His behavior is normal. Judgment and thought content normal.       Assessment & Plan:   Problem List Items Addressed This Visit      Other   Hyperlipidemia    Not currently maintained on medication. Obtain lipid profile. Continue lifestyle management pending lipid profile results.      Routine general medical examination at a health care facility - Primary    1) Anticipatory Guidance: Discussed importance of wearing a seatbelt while driving and not texting while driving; changing batteries in smoke detector at least once annually; wearing suntan lotion when outside; eating a balanced and moderate diet; getting physical activity at least 30 minutes per day.  2) Immunizations / Screenings / Labs:  Declines tetanus and pneumococcal. All other immunizations are up-to-date per recommendations. Obtain hepatitis C antibody for hepatitis C screening. Obtain prostate  specific antigen for prostate cancer screening. Colon cancer screening is up-to-date with appointment scheduled. All other screenings are up-to-date per recommendations. Obtain CBC, CMET, and lipid profile.    Overall well exam with risk factors for cardiovascular disease including hyperlipidemia. He is of good weight and exercises regularly through work. Risk factors will be monitored through blood work. Encouraged to continue current healthy lifestyle behaviors and choices. Follow-up prevention exam in 1 year. Up office visit for chronic conditions pending blood work as necessary.      Relevant Orders   CBC (Completed)   Comprehensive metabolic panel (Completed)   Lipid panel (Completed)   PSA (Completed)   Hepatitis C antibody   Elevated TSH    Previously noted to have elevated TSH with repeat testing not performed. Obtain TSH and T4 for follow-up. Continue with lifestyle management pending TSH results.      Relevant Orders   TSH (Completed)   T4, free (Completed)       Mr. Lepore does not currently have medications on file.   Follow-up: Return in about 3 months (around 10/15/2016), or if symptoms worsen or fail to improve.   Mauricio Po, FNP

## 2016-07-16 ENCOUNTER — Other Ambulatory Visit: Payer: Self-pay | Admitting: Family

## 2016-07-16 ENCOUNTER — Telehealth: Payer: Self-pay | Admitting: Family

## 2016-07-16 DIAGNOSIS — R7989 Other specified abnormal findings of blood chemistry: Secondary | ICD-10-CM

## 2016-07-16 LAB — HEPATITIS C ANTIBODY: HCV Ab: NEGATIVE

## 2016-07-16 NOTE — Telephone Encounter (Signed)
Pt aware of results 

## 2016-07-16 NOTE — Telephone Encounter (Signed)
Pt would like results from yesterday. Please call back.

## 2016-07-23 ENCOUNTER — Ambulatory Visit
Admission: RE | Admit: 2016-07-23 | Discharge: 2016-07-23 | Disposition: A | Discharge status: Home / Self Care | Payer: BLUE CROSS/BLUE SHIELD | Source: Ambulatory Visit | Attending: Family | Admitting: Family

## 2016-07-23 DIAGNOSIS — R7989 Other specified abnormal findings of blood chemistry: Secondary | ICD-10-CM

## 2016-07-28 ENCOUNTER — Other Ambulatory Visit: Payer: Self-pay

## 2016-07-28 ENCOUNTER — Telehealth: Payer: Self-pay

## 2016-07-28 DIAGNOSIS — E065 Other chronic thyroiditis: Secondary | ICD-10-CM

## 2016-07-28 NOTE — Telephone Encounter (Signed)
Prescription written to be mailed.

## 2016-07-28 NOTE — Telephone Encounter (Signed)
Rx has been sent in the mail.

## 2016-07-28 NOTE — Telephone Encounter (Signed)
Please write an rx for pt to have PSA, TSH, and cholesterol rechecked. Wants this rx mailed to him so he can go to [facility] to have these rechecked.

## 2016-08-17 ENCOUNTER — Ambulatory Visit: Payer: BLUE CROSS/BLUE SHIELD

## 2016-08-17 VITALS — Ht 71.0 in | Wt 191.8 lb

## 2016-08-17 DIAGNOSIS — Z860100 Personal history of colon polyps, unspecified: Secondary | ICD-10-CM

## 2016-08-17 MED ORDER — SUPREP BOWEL PREP KIT 17.5-3.13-1.6 GM/177ML PO SOLN: 1.0000 | Freq: Once | ORAL | 0 refills | Status: AC

## 2016-08-17 NOTE — Progress Notes (Signed)
No allergies to eggs or soy No past problems with anesthesia No home oxygen No diet meds  Declined emmi 

## 2016-08-18 ENCOUNTER — Encounter: Payer: Self-pay | Admitting: Gastroenterology

## 2016-08-31 ENCOUNTER — Ambulatory Visit (AMBULATORY_SURGERY_CENTER): Payer: BLUE CROSS/BLUE SHIELD | Admitting: Gastroenterology

## 2016-08-31 ENCOUNTER — Encounter: Payer: Self-pay | Admitting: Gastroenterology

## 2016-08-31 VITALS — BP 119/62 | HR 48 | Temp 98.2°F | Resp 9 | Ht 71.0 in | Wt 191.0 lb

## 2016-08-31 DIAGNOSIS — D126 Benign neoplasm of colon, unspecified: Secondary | ICD-10-CM

## 2016-08-31 DIAGNOSIS — K635 Polyp of colon: Secondary | ICD-10-CM | POA: Diagnosis not present

## 2016-08-31 DIAGNOSIS — Z86010 Personal history of colonic polyps: Secondary | ICD-10-CM | POA: Diagnosis present

## 2016-08-31 DIAGNOSIS — D125 Benign neoplasm of sigmoid colon: Secondary | ICD-10-CM

## 2016-08-31 DIAGNOSIS — D123 Benign neoplasm of transverse colon: Secondary | ICD-10-CM

## 2016-08-31 MED ORDER — SODIUM CHLORIDE 0.9 % IV SOLN: 500.0000 mL | INTRAVENOUS | Status: DC

## 2016-08-31 NOTE — Progress Notes (Signed)
Report given to PACU, vss 

## 2016-08-31 NOTE — Progress Notes (Signed)
Pt's states no medical or surgical changes since previsit or office visit. 

## 2016-08-31 NOTE — Op Note (Signed)
Zachary Duncan Patient Name: Zachary Duncan Procedure Date: 08/31/2016 8:43 AM MRN: [id number] Endoscopist: Mallie Mussel L. Loletha Carrow , MD Age: 66 Referring MD:  Date of Birth: 1950-08-30 Gender: Male Account #: [id number] Procedure:                Colonoscopy Indications:              Surveillance: Personal history of adenomatous                            polyps on last colonoscopy > 5 years ago (64mm TVA                            02/2008) Medicines:                Monitored Anesthesia Care Procedure:                Pre-Anesthesia Assessment:                           - Prior to the procedure, a History and Physical                            was performed, and patient medications and                            allergies were reviewed. The patient's tolerance of                            previous anesthesia was also reviewed. The risks                            and benefits of the procedure and the sedation                            options and risks were discussed with the patient.                            All questions were answered, and informed consent                            was obtained. Prior Anticoagulants: The patient has                            taken no previous anticoagulant or antiplatelet                            agents. ASA Grade Assessment: I - A normal, healthy                            patient. After reviewing the risks and benefits,                            the patient was deemed in satisfactory condition to  undergo the procedure.                           After obtaining informed consent, the colonoscope                            was passed under direct vision. Throughout the                            procedure, the patient's blood pressure, pulse, and                            oxygen saturations were monitored continuously. The                            Colonoscope was introduced through the anus and                             advanced to the the cecum, identified by                            appendiceal orifice and ileocecal valve. The                            colonoscopy was performed without difficulty. The                            patient tolerated the procedure well. The quality                            of the bowel preparation was excellent. The                            ileocecal valve, appendiceal orifice, and rectum                            were photographed. The quality of the bowel                            preparation was evaluated using the BBPS [facility]                            Bowel Preparation Scale) with scores of: Right                            Colon = 3, Transverse Colon = 3 and Left Colon = 3                            (entire mucosa seen well with no residual staining,                            small fragments of stool or opaque liquid). The  total BBPS score equals 9. The bowel preparation                            used was SUPREP. Scope In: 8:50:17 AM Scope Out: 9:07:24 AM Scope Withdrawal Time: 0 hours 13 minutes 59 seconds  Total Procedure Duration: 0 hours 17 minutes 7 seconds  Findings:                 The perianal and digital rectal examinations were                            normal.                           Three sessile polyps were found in the sigmoid                            colon, proximal transverse colon and mid transverse                            colon. The polyps were 2 mm in size. These polyps                            were removed with a cold snare. Resection and                            retrieval were complete.                           The colon (entire examined portion) was moderately                            redundant.                           The exam was otherwise without abnormality on                            direct and retroflexion views. Complications:            No immediate  complications. Estimated Blood Loss:     Estimated blood loss: none. Impression:               - Three 2 mm polyps in the sigmoid colon, in the                            proximal transverse colon and in the mid transverse                            colon, removed with a cold snare. Resected and                            retrieved.                           - Redundant colon.                           -  The examination was otherwise normal on direct                            and retroflexion views. Recommendation:           - Patient has a contact number available for                            emergencies. The signs and symptoms of potential                            delayed complications were discussed with the                            patient. Return to normal activities tomorrow.                            Written discharge instructions were provided to the                            patient.                           - Resume previous diet.                           - Continue present medications.                           - Await pathology results.                           - Repeat colonoscopy is recommended for                            surveillance. The colonoscopy date will be                            determined after pathology results from today's                            exam become available for review. Traver Meckes L. Loletha Carrow, MD 08/31/2016 9:10:32 AM This report has been signed electronically.

## 2016-08-31 NOTE — Progress Notes (Signed)
No problems noted in the recovery room. maw 

## 2016-08-31 NOTE — Patient Instructions (Signed)
YOU HAD AN ENDOSCOPIC PROCEDURE TODAY AT THE [facility]:   Refer to the procedure report that was given to you for any specific questions about what was found during the examination.  If the procedure report does not answer your questions, please call your gastroenterologist to clarify.  If you requested that your care partner not be given the details of your procedure findings, then the procedure report has been included in a sealed envelope for you to review at your convenience later.  YOU SHOULD EXPECT: Some feelings of bloating in the abdomen. Passage of more gas than usual.  Walking can help get rid of the air that was put into your GI tract during the procedure and reduce the bloating. If you had a lower endoscopy (such as a colonoscopy or flexible sigmoidoscopy) you may notice spotting of blood in your stool or on the toilet paper. If you underwent a bowel prep for your procedure, you may not have a normal bowel movement for a few days.  Please Note:  You might notice some irritation and congestion in your nose or some drainage.  This is from the oxygen used during your procedure.  There is no need for concern and it should clear up in a day or so.  SYMPTOMS TO REPORT IMMEDIATELY:   Following lower endoscopy (colonoscopy or flexible sigmoidoscopy):  Excessive amounts of blood in the stool  Significant tenderness or worsening of abdominal pains  Swelling of the abdomen that is new, acute  Fever of 100F or higher   For urgent or emergent issues, a gastroenterologist can be reached at any hour by calling [phone].   DIET:  We do recommend a small meal at first, but then you may proceed to your regular diet.  Drink plenty of fluids but you should avoid alcoholic beverages for 24 hours.  ACTIVITY:  You should plan to take it easy for the rest of today and you should NOT DRIVE or use heavy machinery until tomorrow (because of the sedation medicines used during the test).     FOLLOW UP: Our staff will call the number listed on your records the next business day following your procedure to check on you and address any questions or concerns that you may have regarding the information given to you following your procedure. If we do not reach you, we will leave a message.  However, if you are feeling well and you are not experiencing any problems, there is no need to return our call.  We will assume that you have returned to your regular daily activities without incident.  If any biopsies were taken you will be contacted by phone or by letter within the next 1-3 weeks.  Please call us at [phone] if you have not heard about the biopsies in 3 weeks.    SIGNATURES/CONFIDENTIALITY: You and/or your care partner have signed paperwork which will be entered into your electronic medical record.  These signatures attest to the fact that that the information above on your After Visit Summary has been reviewed and is understood.  Full responsibility of the confidentiality of this discharge information lies with you and/or your care-partner.   Handout was given to your care partner on polyps. You may resume your current medications today. Await biopsy results. Please call if any questions or concerns.   

## 2016-09-01 ENCOUNTER — Telehealth: Payer: Self-pay

## 2016-09-01 NOTE — Telephone Encounter (Signed)
  Follow up Call-  Call back number 08/31/2016  Post procedure Call Back phone  # [phone]  Permission to leave phone message Yes  Some recent data might be hidden     Patient questions:  Do you have a fever, pain , or abdominal swelling? No. Pain Score  0 *  Have you tolerated food without any problems? Yes.    Have you been able to return to your normal activities? Yes.    Do you have any questions about your discharge instructions: Diet   No. Medications  No. Follow up visit  No.  Do you have questions or concerns about your Care? No.  Actions: * If pain score is 4 or above: No action needed, pain <4.  No problems noted per pt. maw

## 2016-09-04 ENCOUNTER — Encounter: Payer: Self-pay | Admitting: Gastroenterology

## 2016-09-23 ENCOUNTER — Encounter: Payer: Self-pay | Admitting: Endocrinology

## 2016-09-23 ENCOUNTER — Ambulatory Visit (INDEPENDENT_AMBULATORY_CARE_PROVIDER_SITE_OTHER): Payer: BLUE CROSS/BLUE SHIELD | Admitting: Endocrinology

## 2016-09-23 VITALS — BP 132/84 | HR 52 | Ht 70.5 in | Wt 194.0 lb

## 2016-09-23 DIAGNOSIS — R946 Abnormal results of thyroid function studies: Secondary | ICD-10-CM | POA: Diagnosis not present

## 2016-09-23 DIAGNOSIS — R7989 Other specified abnormal findings of blood chemistry: Secondary | ICD-10-CM

## 2016-09-23 MED ORDER — LEVOTHYROXINE SODIUM 50 MCG PO TABS: 50.0000 ug | ORAL_TABLET | Freq: Every day | ORAL | 3 refills | Status: DC

## 2016-09-23 NOTE — Patient Instructions (Addendum)
I have sent a prescription to your pharmacy, for the thyroid.   Please come back to do the blood tests in 1 month.   I would be happy to see you back here as needed.        Hypothyroidism Hypothyroidism is a disorder of the thyroid. The thyroid is a large gland that is located in the lower front of the neck. The thyroid releases hormones that control how the body works. With hypothyroidism, the thyroid does not make enough of these hormones. What are the causes? Causes of hypothyroidism may include:  Viral infections.  Pregnancy.  Your own defense system (immune system) attacking your thyroid.  Certain medicines.  Birth defects.  Past radiation treatments to your head or neck.  Past treatment with radioactive iodine.  Past surgical removal of part or all of your thyroid.  Problems with the gland that is located in the center of your brain (pituitary).  What are the signs or symptoms? Signs and symptoms of hypothyroidism may include:  Feeling as though you have no energy (lethargy).  Inability to tolerate cold.  Weight gain that is not explained by a change in diet or exercise habits.  Dry skin.  Coarse hair.  Menstrual irregularity.  Slowing of thought processes.  Constipation.  Sadness or depression.  How is this diagnosed? Your health care provider may diagnose hypothyroidism with blood tests and ultrasound tests. How is this treated? Hypothyroidism is treated with medicine that replaces the hormones that your body does not make. After you begin treatment, it may take several weeks for symptoms to go away. Follow these instructions at home:  Take medicines only as directed by your health care provider.  If you start taking any new medicines, tell your health care provider.  Keep all follow-up visits as directed by your health care provider. This is important. As your condition improves, your dosage needs may change. You will need to have blood tests  regularly so that your health care provider can watch your condition. Contact a health care provider if:  Your symptoms do not get better with treatment.  You are taking thyroid replacement medicine and: ? You sweat excessively. ? You have tremors. ? You feel anxious. ? You lose weight rapidly. ? You cannot tolerate heat. ? You have emotional swings. ? You have diarrhea. ? You feel weak. Get help right away if:  You develop chest pain.  You develop an irregular heartbeat.  You develop a rapid heartbeat. This information is not intended to replace advice given to you by your health care provider. Make sure you discuss any questions you have with your health care provider. Document Released: 03/02/2005 Document Revised: 08/08/2015 Document Reviewed: 07/18/2013 Elsevier Interactive Patient Education  2017 [employer].

## 2016-09-23 NOTE — Progress Notes (Signed)
Subjective:    Patient ID: Zachary Duncan, male    DOB: 09/14/1950, 66 y.o.   MRN: [id number]  HPI Pt is referred by Terri Piedra, NP,for hypothyroidism.  hypothyroidism was dx'ed in 2008.  He has never been on prescribed thyroid hormone therapy.  He has never taken kelp or any other type of non-prescribed thyroid product.  He has never had thyroid surgery, or XRT to the neck.  He has never been on amiodarone or lithium.  He reports slight dry skin, and assoc easy bruising.  Past Medical History:  Diagnosis Date  . Dermatophytosis of nail 05/10/2007  . Hyperlipidemia     Past Surgical History:  Procedure Laterality Date  . COLONOSCOPY W/ BIOPSIES AND POLYPECTOMY      Social History   Social History  . Marital status: Divorced    Spouse name: N/A  . Number of children: 1  . Years of education: 85   Occupational History  . [employer]    Social History Main Topics  . Smoking status: Never Smoker  . Smokeless tobacco: Never Used  . Alcohol use Yes     Comment: 2-3 monthly  . Drug use: No  . Sexual activity: Not on file   Other Topics Concern  . Not on file   Social History Narrative   Fun: Tennis when able    Denies religious beliefs effecting health care.     No current outpatient prescriptions on file prior to visit.   Current Facility-Administered Medications on File Prior to Visit  Medication Dose Route Frequency Provider Last Rate Last Dose  . 0.9 %  sodium chloride infusion  500 mL Intravenous Continuous Danis, Estill Cotta III, MD        No Known Allergies  Family History  Problem Relation Age of Onset  . Heart disease Mother   . Heart disease Father   . Colon cancer Neg Hx   . Thyroid disease Neg Hx     BP 132/84   Pulse (!) 52   Ht 5' 10.5" (1.791 m)   Wt 194 lb (88 kg)   SpO2 98%   BMI 27.44 kg/m    Review of Systems denies depression, muscle cramps, sob, weight gain, memory loss, constipation, numbness, diplopia, cold intolerance,  myalgias, rhinorrhea, and syncope.        Objective:   Physical Exam VS: see vs page GEN: no distress HEAD: head: no deformity eyes: no periorbital swelling, no proptosis external nose and ears are normal mouth: no lesion seen NECK: supple, thyroid is not enlarged CHEST WALL: no deformity LUNGS: clear to auscultation CV: reg rate and rhythm, no murmur ABD: abdomen is soft, nontender.  no hepatosplenomegaly.  not distended.  no hernia MUSCULOSKELETAL: muscle bulk and strength are grossly normal.  no obvious joint swelling.  gait is normal and steady EXTEMITIES: no deformity.  no edema PULSES: no carotid bruit NEURO:  cn 2-12 grossly intact.   readily moves all 4's.  sensation is intact to touch on all 4's SKIN:  Normal texture and temperature.  No rash or suspicious lesion is visible.   NODES:  None palpable at the neck PSYCH: alert, well-oriented.  Does not appear anxious nor depressed.  [place]: Mildly heterogeneous thyroid gland suggesting chronic thyroiditis.  An echogenic nodule in the left superior gland is most consistent with a focus of active thyroiditis.   Lab Results  Component Value Date   TSH 11.24 (H) 07/15/2016   I have reviewed outside  records, and summarized: Pt was noted to have elevated TSH, and referred here.  rx was advised, but pt was hesitant to take.      Assessment & Plan:  Hypothyroidism, new Bradycardia: although this could be physiologic, this would be considered an indication for synthroid.  We discussed, and pt agrees.     Patient Instructions  I have sent a prescription to your pharmacy, for the thyroid.   Please come back to do the blood tests in 1 month.   I would be happy to see you back here as needed.        Hypothyroidism Hypothyroidism is a disorder of the thyroid. The thyroid is a large gland that is located in the lower front of the neck. The thyroid releases hormones that control how the body works. With hypothyroidism, the thyroid  does not make enough of these hormones. What are the causes? Causes of hypothyroidism may include:  Viral infections.  Pregnancy.  Your own defense system (immune system) attacking your thyroid.  Certain medicines.  Birth defects.  Past radiation treatments to your head or neck.  Past treatment with radioactive iodine.  Past surgical removal of part or all of your thyroid.  Problems with the gland that is located in the center of your brain (pituitary).  What are the signs or symptoms? Signs and symptoms of hypothyroidism may include:  Feeling as though you have no energy (lethargy).  Inability to tolerate cold.  Weight gain that is not explained by a change in diet or exercise habits.  Dry skin.  Coarse hair.  Menstrual irregularity.  Slowing of thought processes.  Constipation.  Sadness or depression.  How is this diagnosed? Your health care provider may diagnose hypothyroidism with blood tests and ultrasound tests. How is this treated? Hypothyroidism is treated with medicine that replaces the hormones that your body does not make. After you begin treatment, it may take several weeks for symptoms to go away. Follow these instructions at home:  Take medicines only as directed by your health care provider.  If you start taking any new medicines, tell your health care provider.  Keep all follow-up visits as directed by your health care provider. This is important. As your condition improves, your dosage needs may change. You will need to have blood tests regularly so that your health care provider can watch your condition. Contact a health care provider if:  Your symptoms do not get better with treatment.  You are taking thyroid replacement medicine and: ? You sweat excessively. ? You have tremors. ? You feel anxious. ? You lose weight rapidly. ? You cannot tolerate heat. ? You have emotional swings. ? You have diarrhea. ? You feel weak. Get help  right away if:  You develop chest pain.  You develop an irregular heartbeat.  You develop a rapid heartbeat. This information is not intended to replace advice given to you by your health care provider. Make sure you discuss any questions you have with your health care provider. Document Released: 03/02/2005 Document Revised: 08/08/2015 Document Reviewed: 07/18/2013 Elsevier Interactive Patient Education  2017 [employer].

## 2017-07-20 ENCOUNTER — Encounter: Payer: BLUE CROSS/BLUE SHIELD | Admitting: Family

## 2017-08-17 ENCOUNTER — Ambulatory Visit (INDEPENDENT_AMBULATORY_CARE_PROVIDER_SITE_OTHER): Payer: BLUE CROSS/BLUE SHIELD | Admitting: Family Medicine

## 2017-08-17 ENCOUNTER — Encounter: Payer: Self-pay | Admitting: Family Medicine

## 2017-08-17 VITALS — BP 128/80 | HR 54 | Ht 70.5 in | Wt 191.4 lb

## 2017-08-17 DIAGNOSIS — R972 Elevated prostate specific antigen [PSA]: Secondary | ICD-10-CM | POA: Diagnosis not present

## 2017-08-17 DIAGNOSIS — Z0001 Encounter for general adult medical examination with abnormal findings: Secondary | ICD-10-CM | POA: Diagnosis not present

## 2017-08-17 DIAGNOSIS — H6123 Impacted cerumen, bilateral: Secondary | ICD-10-CM

## 2017-08-17 DIAGNOSIS — E039 Hypothyroidism, unspecified: Secondary | ICD-10-CM | POA: Diagnosis not present

## 2017-08-17 LAB — URINALYSIS, ROUTINE W REFLEX MICROSCOPIC
BILIRUBIN URINE: NEGATIVE
Hgb urine dipstick: NEGATIVE
KETONES UR: NEGATIVE
Leukocytes, UA: NEGATIVE
NITRITE: NEGATIVE
RBC / HPF: NONE SEEN (ref 0–?)
Total Protein, Urine: NEGATIVE
URINE GLUCOSE: NEGATIVE
UROBILINOGEN UA: 0.2 (ref 0.0–1.0)
pH: 5.5 (ref 5.0–8.0)

## 2017-08-17 LAB — LIPID PANEL
CHOLESTEROL: 183 mg/dL (ref 0–200)
HDL: 33.6 mg/dL — ABNORMAL LOW (ref >39.00)
LDL Cholesterol: 125 mg/dL — ABNORMAL HIGH (ref 0–99)
NonHDL: 149.7
TRIGLYCERIDES: 126 mg/dL (ref 0.0–149.0)
Total CHOL/HDL Ratio: 5
VLDL: 25.2 mg/dL (ref 0.0–40.0)

## 2017-08-17 LAB — COMPREHENSIVE METABOLIC PANEL
ALK PHOS: 87 U/L (ref 39–117)
ALT: 14 U/L (ref 0–53)
AST: 13 U/L (ref 0–37)
Albumin: 4.6 g/dL (ref 3.5–5.2)
BILIRUBIN TOTAL: 2 mg/dL — AB (ref 0.2–1.2)
BUN: 15 mg/dL (ref 6–23)
CO2: 29 meq/L (ref 19–32)
CREATININE: 0.96 mg/dL (ref 0.40–1.50)
Calcium: 9.4 mg/dL (ref 8.4–10.5)
Chloride: 105 mEq/L (ref 96–112)
GFR: 83.09 mL/min (ref >60.00)
GLUCOSE: 98 mg/dL (ref 70–99)
Potassium: 4.2 mEq/L (ref 3.5–5.1)
Sodium: 141 mEq/L (ref 135–145)
TOTAL PROTEIN: 6.4 g/dL (ref 6.0–8.3)

## 2017-08-17 LAB — PSA: PSA: 3.65 ng/mL (ref 0.10–4.00)

## 2017-08-17 LAB — CBC
HCT: 43 % (ref 39.0–52.0)
Hemoglobin: 14.9 g/dL (ref 13.0–17.0)
MCHC: 34.7 g/dL (ref 30.0–36.0)
MCV: 91.3 fl (ref 78.0–100.0)
Platelets: 181 10*3/uL (ref 150.0–400.0)
RBC: 4.7 Mil/uL (ref 4.22–5.81)
RDW: 13.4 % (ref 11.5–15.5)
WBC: 5.7 10*3/uL (ref 4.0–10.5)

## 2017-08-17 LAB — TSH: TSH: 9.37 u[IU]/mL — AB (ref 0.35–4.50)

## 2017-08-17 MED ORDER — EAR WAX CLEANSING 6.5 % OT KIT: PACK | OTIC | 1 refills | Status: DC

## 2017-08-17 MED ORDER — LEVOTHYROXINE SODIUM 50 MCG PO TABS: ORAL_TABLET | ORAL | 0 refills | Status: DC

## 2017-08-17 NOTE — Patient Instructions (Signed)
Earwax Buildup, Adult The ears produce a substance called earwax that helps keep bacteria out of the ear and protects the skin in the ear canal. Occasionally, earwax can build up in the ear and cause discomfort or hearing loss. What increases the risk? This condition is more likely to develop in people who:  Are male.  Are elderly.  Naturally produce more earwax.  Clean their ears often with cotton swabs.  Use earplugs often.  Use in-ear headphones often.  Wear hearing aids.  Have narrow ear canals.  Have earwax that is overly thick or sticky.  Have eczema.  Are dehydrated.  Have excess hair in the ear canal.  What are the signs or symptoms? Symptoms of this condition include:  Reduced or muffled hearing.  A feeling of fullness in the ear or feeling that the ear is plugged.  Fluid coming from the ear.  Ear pain.  Ear itch.  Ringing in the ear.  Coughing.  An obvious piece of earwax that can be seen inside the ear canal.  How is this diagnosed? This condition may be diagnosed based on:  Your symptoms.  Your medical history.  An ear exam. During the exam, your health care provider will look into your ear with an instrument called an otoscope.  You may have tests, including a hearing test. How is this treated? This condition may be treated by:  Using ear drops to soften the earwax.  Having the earwax removed by a health care provider. The health care provider may: ? Flush the ear with water. ? Use an instrument that has a loop on the end (curette). ? Use a suction device.  Surgery to remove the wax buildup. This may be done in severe cases.  Follow these instructions at home:  Take over-the-counter and prescription medicines only as told by your health care provider.  Do not put any objects, including cotton swabs, into your ear. You can clean the opening of your ear canal with a washcloth or facial tissue.  Follow instructions from your health  care provider about cleaning your ears. Do not over-clean your ears.  Drink enough fluid to keep your urine clear or pale yellow. This will help to thin the earwax.  Keep all follow-up visits as told by your health care provider. If earwax builds up in your ears often or if you use hearing aids, consider seeing your health care provider for routine, preventive ear cleanings. Ask your health care provider how often you should schedule your cleanings.  If you have hearing aids, clean them according to instructions from the manufacturer and your health care provider. Contact a health care provider if:  You have ear pain.  You develop a fever.  You have blood, pus, or other fluid coming from your ear.  You have hearing loss.  You have ringing in your ears that does not go away.  Your symptoms do not improve with treatment.  You feel like the room is spinning (vertigo). Summary  Earwax can build up in the ear and cause discomfort or hearing loss.  The most common symptoms of this condition include reduced or muffled hearing and a feeling of fullness in the ear or feeling that the ear is plugged.  This condition may be diagnosed based on your symptoms, your medical history, and an ear exam.  This condition may be treated by using ear drops to soften the earwax or by having the earwax removed by a health care provider.  Do   not put any objects, including cotton swabs, into your ear. You can clean the opening of your ear canal with a washcloth or facial tissue. This information is not intended to replace advice given to you by your health care provider. Make sure you discuss any questions you have with your health care provider. Document Released: 04/09/2004 Document Revised: 05/13/2016 Document Reviewed: 05/13/2016 Elsevier Interactive Patient Education  2018 [place] Maintenance, Male A healthy lifestyle and preventive care is important for your health and wellness. Ask  your health care provider about what schedule of regular examinations is right for you. What should I know about weight and diet? Eat a Healthy Diet  Eat plenty of vegetables, fruits, whole grains, low-fat dairy products, and lean protein.  Do not eat a lot of foods high in solid fats, added sugars, or salt.  Maintain a Healthy Weight Regular exercise can help you achieve or maintain a healthy weight. You should:  Do at least 150 minutes of exercise each week. The exercise should increase your heart rate and make you sweat (moderate-intensity exercise).  Do strength-training exercises at least twice a week.  Watch Your Levels of Cholesterol and Blood Lipids  Have your blood tested for lipids and cholesterol every 5 years starting at 67 years of age. If you are at high risk for heart disease, you should start having your blood tested when you are 67 years old. You may need to have your cholesterol levels checked more often if: ? Your lipid or cholesterol levels are high. ? You are older than 67 years of age. ? You are at high risk for heart disease.  What should I know about cancer screening? Many types of cancers can be detected early and may often be prevented. Lung Cancer  You should be screened every year for lung cancer if: ? You are a current smoker who has smoked for at least 30 years. ? You are a former smoker who has quit within the past 15 years.  Talk to your health care provider about your screening options, when you should start screening, and how often you should be screened.  Colorectal Cancer  Routine colorectal cancer screening usually begins at 67 years of age and should be repeated every 5-10 years until you are 67 years old. You may need to be screened more often if early forms of precancerous polyps or small growths are found. Your health care provider may recommend screening at an earlier age if you have risk factors for colon cancer.  Your health care  provider may recommend using home test kits to check for hidden blood in the stool.  A small camera at the end of a tube can be used to examine your colon (sigmoidoscopy or colonoscopy). This checks for the earliest forms of colorectal cancer.  Prostate and Testicular Cancer  Depending on your age and overall health, your health care provider may do certain tests to screen for prostate and testicular cancer.  Talk to your health care provider about any symptoms or concerns you have about testicular or prostate cancer.  Skin Cancer  Check your skin from head to toe regularly.  Tell your health care provider about any new moles or changes in moles, especially if: ? There is a change in a mole's size, shape, or color. ? You have a mole that is larger than a pencil eraser.  Always use sunscreen. Apply sunscreen liberally and repeat throughout the day.  Protect yourself by wearing long  sleeves, pants, a wide-brimmed hat, and sunglasses when outside.  What should I know about heart disease, diabetes, and high blood pressure?  If you are 28-80 years of age, have your blood pressure checked every 3-5 years. If you are 70 years of age or older, have your blood pressure checked every year. You should have your blood pressure measured twice-once when you are at a hospital or clinic, and once when you are not at a hospital or clinic. Record the average of the two measurements. To check your blood pressure when you are not at a hospital or clinic, you can use: ? An automated blood pressure machine at a pharmacy. ? A home blood pressure monitor.  Talk to your health care provider about your target blood pressure.  If you are between 28-32 years old, ask your health care provider if you should take aspirin to prevent heart disease.  Have regular diabetes screenings by checking your fasting blood sugar level. ? If you are at a normal weight and have a low risk for diabetes, have this test once every  three years after the age of 38. ? If you are overweight and have a high risk for diabetes, consider being tested at a younger age or more often.  A one-time screening for abdominal aortic aneurysm (AAA) by ultrasound is recommended for men aged 31-75 years who are current or former smokers. What should I know about preventing infection? Hepatitis B If you have a higher risk for hepatitis B, you should be screened for this virus. Talk with your health care provider to find out if you are at risk for hepatitis B infection. Hepatitis C Blood testing is recommended for:  Everyone born from [birth year] through 1965.  Anyone with known risk factors for hepatitis C.  Sexually Transmitted Diseases (STDs)  You should be screened each year for STDs including gonorrhea and chlamydia if: ? You are sexually active and are younger than 67 years of age. ? You are older than 67 years of age and your health care provider tells you that you are at risk for this type of infection. ? Your sexual activity has changed since you were last screened and you are at an increased risk for chlamydia or gonorrhea. Ask your health care provider if you are at risk.  Talk with your health care provider about whether you are at high risk of being infected with HIV. Your health care provider may recommend a prescription medicine to help prevent HIV infection.  What else can I do?  Schedule regular health, dental, and eye exams.  Stay current with your vaccines (immunizations).  Do not use any tobacco products, such as cigarettes, chewing tobacco, and e-cigarettes. If you need help quitting, ask your health care provider.  Limit alcohol intake to no more than 2 drinks per day. One drink equals 12 ounces of beer, 5 ounces of wine, or 1 ounces of hard liquor.  Do not use street drugs.  Do not share needles.  Ask your health care provider for help if you need support or information about quitting drugs.  Tell your health  care provider if you often feel depressed.  Tell your health care provider if you have ever been abused or do not feel safe at home. This information is not intended to replace advice given to you by your health care provider. Make sure you discuss any questions you have with your health care provider. Document Released: 08/29/2007 Document Revised: 10/30/2015 Document Reviewed: 12/04/2014  Elsevier Interactive Patient Education  2018 [employer]. Pneumococcal Vaccine, Polyvalent solution for injection What is this medicine? PNEUMOCOCCAL VACCINE, POLYVALENT (NEU mo KOK al vak SEEN, pol ee VEY luhnt) is a vaccine to prevent pneumococcus bacteria infection. These bacteria are a major cause of ear infections, Strep throat infections, and serious pneumonia, meningitis, or blood infections worldwide. These vaccines help the body to produce antibodies (protective substances) that help your body defend against these bacteria. This vaccine is recommended for people 49 years of age and older with health problems. It is also recommended for all adults over 57 years old. This vaccine will not treat an infection. This medicine may be used for other purposes; ask your health care provider or pharmacist if you have questions. COMMON BRAND NAME(S): Pneumovax 23 What should I tell my health care provider before I take this medicine? They need to know if you have any of these conditions: -bleeding problems -bone marrow or organ transplant -cancer, Hodgkin's disease -fever -infection -immune system problems -low platelet count in the blood -seizures -an unusual or allergic reaction to pneumococcal vaccine, diphtheria toxoid, other vaccines, latex, other medicines, foods, dyes, or preservatives -pregnant or trying to get pregnant -breast-feeding How should I use this medicine? This vaccine is for injection into a muscle or under the skin. It is given by a health care professional. A copy of Vaccine  Information Statements will be given before each vaccination. Read this sheet carefully each time. The sheet may change frequently. Talk to your pediatrician regarding the use of this medicine in children. While this drug may be prescribed for children as young as 78 years of age for selected conditions, precautions do apply. Overdosage: If you think you have taken too much of this medicine contact a poison control center or emergency room at once. NOTE: This medicine is only for you. Do not share this medicine with others. What if I miss a dose? It is important not to miss your dose. Call your doctor or health care professional if you are unable to keep an appointment. What may interact with this medicine? -medicines for cancer chemotherapy -medicines that suppress your immune function -medicines that treat or prevent blood clots like warfarin, enoxaparin, and dalteparin -steroid medicines like prednisone or cortisone This list may not describe all possible interactions. Give your health care provider a list of all the medicines, herbs, non-prescription drugs, or dietary supplements you use. Also tell them if you smoke, drink alcohol, or use illegal drugs. Some items may interact with your medicine. What should I watch for while using this medicine? Mild fever and pain should go away in 3 days or less. Report any unusual symptoms to your doctor or health care professional. What side effects may I notice from receiving this medicine? Side effects that you should report to your doctor or health care professional as soon as possible: -allergic reactions like skin rash, itching or hives, swelling of the face, lips, or tongue -breathing problems -confused -fever over 102 degrees F -pain, tingling, numbness in the hands or feet -seizures -unusual bleeding or bruising -unusual muscle weakness Side effects that usually do not require medical attention (report to your doctor or health care  professional if they continue or are bothersome): -aches and pains -diarrhea -fever of 102 degrees F or less -headache -irritable -loss of appetite -pain, tender at site where injected -trouble sleeping This list may not describe all possible side effects. Call your doctor for medical advice about side effects. You may  report side effects to FDA at 1-800-FDA-1088. Where should I keep my medicine? This does not apply. This vaccine is given in a clinic, pharmacy, doctor's office, or other health care setting and will not be stored at home. NOTE: This sheet is a summary. It may not cover all possible information. If you have questions about this medicine, talk to your doctor, pharmacist, or health care provider.  2018 Elsevier/Gold Standard (2007-10-07 14:32:37)  Hypothyroidism Hypothyroidism is a disorder of the thyroid. The thyroid is a large gland that is located in the lower front of the neck. The thyroid releases hormones that control how the body works. With hypothyroidism, the thyroid does not make enough of these hormones. What are the causes? Causes of hypothyroidism may include:  Viral infections.  Pregnancy.  Your own defense system (immune system) attacking your thyroid.  Certain medicines.  Birth defects.  Past radiation treatments to your head or neck.  Past treatment with radioactive iodine.  Past surgical removal of part or all of your thyroid.  Problems with the gland that is located in the center of your brain (pituitary).  What are the signs or symptoms? Signs and symptoms of hypothyroidism may include:  Feeling as though you have no energy (lethargy).  Inability to tolerate cold.  Weight gain that is not explained by a change in diet or exercise habits.  Dry skin.  Coarse hair.  Menstrual irregularity.  Slowing of thought processes.  Constipation.  Sadness or depression.  How is this diagnosed? Your health care provider may diagnose  hypothyroidism with blood tests and ultrasound tests. How is this treated? Hypothyroidism is treated with medicine that replaces the hormones that your body does not make. After you begin treatment, it may take several weeks for symptoms to go away. Follow these instructions at home:  Take medicines only as directed by your health care provider.  If you start taking any new medicines, tell your health care provider.  Keep all follow-up visits as directed by your health care provider. This is important. As your condition improves, your dosage needs may change. You will need to have blood tests regularly so that your health care provider can watch your condition. Contact a health care provider if:  Your symptoms do not get better with treatment.  You are taking thyroid replacement medicine and: ? You sweat excessively. ? You have tremors. ? You feel anxious. ? You lose weight rapidly. ? You cannot tolerate heat. ? You have emotional swings. ? You have diarrhea. ? You feel weak. Get help right away if:  You develop chest pain.  You develop an irregular heartbeat.  You develop a rapid heartbeat. This information is not intended to replace advice given to you by your health care provider. Make sure you discuss any questions you have with your health care provider. Document Released: 03/02/2005 Document Revised: 08/08/2015 Document Reviewed: 07/18/2013 Elsevier Interactive Patient Education  2018 [employer].

## 2017-08-17 NOTE — Progress Notes (Addendum)
Subjective:  Patient ID: Zachary Duncan, male    DOB: [birth year]  Age: 67 y.o. MRN: [id number]  CC: Annual Exam and transfer of care   HPI Zachary Duncan presents for a physical exam and follow-up of some medical issues.  To his knowledge he enjoys good health.  He works as a Health and safety inspector.  He lives alone.  He is not currently physically active but does achieve an average of 10,000 steps daily when he works.  He is divorced and lives alone.  He has 1 son who lives in [place] with a 83-year-old granddaughter.  They get together about twice a month.  Patient does not smoke.  He drinks alcohol on occasion.  He does not use illicit drugs.  Chart review shows that he has hypothyroidism.  He tells me that a doctor told him he was borderline.  His last TSH was measured at 11.  Thyroid hormone replacement was recommended but he read the product she and became concerned about the listed side effects of thyroid replacement medications.  PSA velocity was noted in the chart.  He is noted this some decrease in the force of his stream but it is not been significant.  The only time he gets up at night to pee is if he drinks water prior to retiring.  He did have a colonoscopy last year.  He is never had the pneumonia vaccines.  Recent dilated eye exam was normal he tells me.  Outpatient Medications Prior to Visit  Medication Sig Dispense Refill  . levothyroxine (SYNTHROID, LEVOTHROID) 50 MCG tablet Take 1 tablet (50 mcg total) by mouth daily. 90 tablet 3  . 0.9 %  sodium chloride infusion      No facility-administered medications prior to visit.     ROS Review of Systems  Constitutional: Negative for activity change, chills, fatigue and unexpected weight change.  HENT: Negative.  Negative for ear discharge, ear pain and hearing loss.   Eyes: Negative for photophobia and visual disturbance.  Respiratory: Negative.   Cardiovascular: Negative.   Gastrointestinal: Negative.     Endocrine: Negative for cold intolerance and heat intolerance.  Genitourinary: Negative for difficulty urinating, frequency and urgency.  Musculoskeletal: Negative for gait problem and joint swelling.  Skin: Negative for pallor and rash.  Allergic/Immunologic: Negative for immunocompromised state.  Neurological: Negative for weakness, light-headedness and headaches.  Hematological: Does not bruise/bleed easily.  Psychiatric/Behavioral: Negative.     Objective:  BP 128/80   Pulse (!) 54   Ht 5' 10.5" (1.791 m)   Wt 191 lb 6 oz (86.8 kg)   SpO2 99%   BMI 27.07 kg/m   BP Readings from Last 3 Encounters:  08/17/17 128/80  09/23/16 132/84  08/31/16 119/62    Wt Readings from Last 3 Encounters:  08/17/17 191 lb 6 oz (86.8 kg)  09/23/16 194 lb (88 kg)  08/31/16 191 lb (86.6 kg)    Physical Exam  Constitutional: He is oriented to person, place, and time. He appears well-developed and well-nourished. No distress.  HENT:  Head: Normocephalic and atraumatic.  Right Ear: External ear normal. A foreign body is present.  Left Ear: External ear normal. A foreign body is present.  Ears:  Nose: Nose normal.  Mouth/Throat: Oropharynx is clear and moist. No oropharyngeal exudate.  Eyes: Pupils are equal, round, and reactive to light. Conjunctivae and EOM are normal. Right eye exhibits no discharge. Left eye exhibits no discharge. No scleral icterus.  Neck: Normal range of motion. Neck supple. No JVD present. No tracheal deviation present. No thyromegaly present.  Cardiovascular: Normal rate, regular rhythm and normal heart sounds.  Pulmonary/Chest: Effort normal and breath sounds normal.  Abdominal: Soft. Bowel sounds are normal. He exhibits no distension and no mass. There is no tenderness. There is no guarding.  Genitourinary: Rectal exam shows no external hemorrhoid, no internal hemorrhoid, no fissure, no mass, no tenderness, anal tone normal and guaiac negative stool. Prostate is not  enlarged and not tender.  Musculoskeletal: He exhibits no edema.  Lymphadenopathy:    He has no cervical adenopathy.  Neurological: He is alert and oriented to person, place, and time.  Skin: Skin is warm and dry. Capillary refill takes less than 2 seconds. No rash noted. He is not diaphoretic. No erythema. No pallor.  Psychiatric: He has a normal mood and affect. His behavior is normal.    Lab Results  Component Value Date   WBC 5.7 08/17/2017   HGB 14.9 08/17/2017   HCT 43.0 08/17/2017   PLT 181.0 08/17/2017   GLUCOSE 98 08/17/2017   CHOL 183 08/17/2017   TRIG 126.0 08/17/2017   HDL 33.60 (L) 08/17/2017   LDLDIRECT 130.0 07/15/2016   LDLCALC 125 (H) 08/17/2017   ALT 14 08/17/2017   AST 13 08/17/2017   NA 141 08/17/2017   K 4.2 08/17/2017   CL 105 08/17/2017   CREATININE 0.96 08/17/2017   BUN 15 08/17/2017   CO2 29 08/17/2017   TSH 9.37 (H) 08/17/2017   PSA 3.65 08/17/2017    US Thyroid  Result Date: 07/23/2016 CLINICAL DATA:  Hypothyroid. 67 year old male with elevated thyroid stimulating hormone EXAM: THYROID ULTRASOUND TECHNIQUE: Ultrasound examination of the thyroid gland and adjacent soft tissues was performed. COMPARISON:  None. FINDINGS: Parenchymal Echotexture: Mildly heterogenous Isthmus: 0.5 cm Right lobe: 4.2 x 2.1 x 1.7 cm Left lobe: 4.8 x 2.5 x 1.2 cm _________________________________________________________ Estimated total number of nodules >/= 1 cm: 0 Number of spongiform nodules >/=  2 cm not described below (TR1): 0 Number of mixed cystic and solid nodules >/= 1.5 cm not described below (TR2): 0 _________________________________________________________ No discrete nodules of consequence are seen within the thyroid gland. Incidental note is made of a echogenic solid nodule in the left superior gland measuring 0.7 cm. This is most consistent with a focus of active thyroiditis. IMPRESSION: 1. Mildly heterogeneous thyroid gland suggesting chronic thyroiditis. 2. An  echogenic nodule in the left superior gland is most consistent with a focus of active thyroiditis. No dedicated follow-up is required for this finding. The above is in keeping with the ACR TI-RADS recommendations - J Am Coll Radiol 2017;14:587-595. Electronically Signed   By: Jacqulynn Cadet M.D.   On: 07/23/2016 15:45    Assessment & Plan:   Zachary Duncan was seen today for annual exam and transfer of care.  Diagnoses and all orders for this visit:  Encounter for health maintenance examination with abnormal findings -     Comprehensive metabolic panel -     CBC -     Lipid panel -     Urinalysis, Routine w reflex microscopic  Excessive cerumen in both ear canals -     Carbamide Peroxide-Saline (EAR WAX CLEANSING) 6.5 % KIT; Use as directed.  Hypothyroidism, unspecified type -     CBC -     TSH -     levothyroxine (SYNTHROID, LEVOTHROID) 50 MCG tablet; Take one each morning on a fasting stomach one hour  prior to eating.  Increased prostate specific antigen (PSA) velocity -     CBC -     PSA   I have discontinued Zachary Duncan's levothyroxine. I am also having him start on EAR WAX CLEANSING and levothyroxine. We will stop administering sodium chloride.  Meds ordered this encounter  Medications  . Carbamide Peroxide-Saline (EAR WAX CLEANSING) 6.5 % KIT    Sig: Use as directed.    Dispense:  1 kit    Refill:  1  . levothyroxine (SYNTHROID, LEVOTHROID) 50 MCG tablet    Sig: Take one each morning on a fasting stomach one hour prior to eating.    Dispense:  90 tablet    Refill:  0   Recommended the pneumonia vaccine today and patient would like to read about it before taking it.  He does not except flu vaccine.  Also recommended that for him with his daily interactions with the public.  Had a long discussion with him about treating his hypothyroidism.  He denies typical symptoms of hypothyroidism but decub increased pulse rate today and elevated triglycerides could be side effects  of hypothyroidism.  Patient was educated today about these matters and their effect on his health.  We will see where his PSA is today.  May consider referral with further elevation.  He is not his exam was essentially normal he is having no symptoms of significance referrable to his prostate.  Follow-up: Return in about 2 months (around 10/17/2017).  Libby Maw, MD   9/16: have asked patient to return for fu of hypothyroidism and psa velocity now. He says that he will return after the first of the year and we will recheck it then.

## 2017-08-17 NOTE — Addendum Note (Signed)
Addended by: Jon Billings on: 08/17/2017 03:43 PM   Modules accepted: Orders

## 2017-11-16 ENCOUNTER — Telehealth: Payer: Self-pay | Admitting: Family Medicine

## 2017-11-16 NOTE — Telephone Encounter (Signed)
Copied from [place] [id number]. Topic: General - Other >> Nov 16, 2017  8:46 AM Lennox Solders wrote: Reason for CRM: pt is calling and would like dr Ethelene Hal to know that he stopped taking thyroid medication over a month ago. Pt was having strange feeling in his chest. Pt said once he stop the medication strange feeling in his chest stopped. Pt cancelled his appt on 11-17-17

## 2017-11-17 ENCOUNTER — Ambulatory Visit: Payer: BLUE CROSS/BLUE SHIELD | Admitting: Family Medicine

## 2017-11-29 NOTE — Telephone Encounter (Signed)
Please schedule patient to see me and return this note to me.

## 2017-11-29 NOTE — Telephone Encounter (Signed)
I called and spoke with patient. He is unable to come in until after the first of the year when his plan will change over to a Medicare plan. As of now, he has a $1500 deductible. Patient is scheduled for 1/23.

## 2018-04-07 ENCOUNTER — Ambulatory Visit: Payer: BLUE CROSS/BLUE SHIELD | Admitting: Family Medicine

## 2018-11-10 IMAGING — US US THYROID
1 series · 13 of 25 positions shown · non-contrast
Comparison: None.

CLINICAL DATA: Hypothyroid. 65-year-old male with elevated thyroid
stimulating hormone

EXAM:
THYROID ULTRASOUND
TECHNIQUE: Ultrasound examination of the thyroid gland and adjacent soft
tissues was performed.

[Series 1: us thyroid · 0.07mm/px · 13 of 37 slices shown]
[im 1/37]
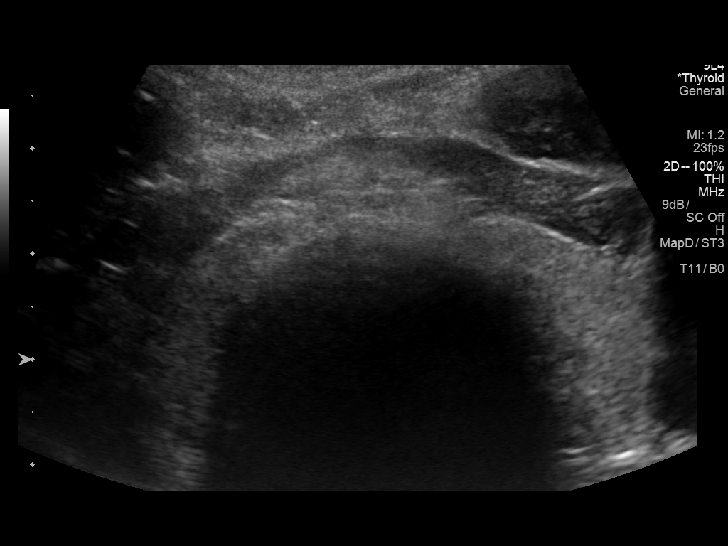
[im 4/37]
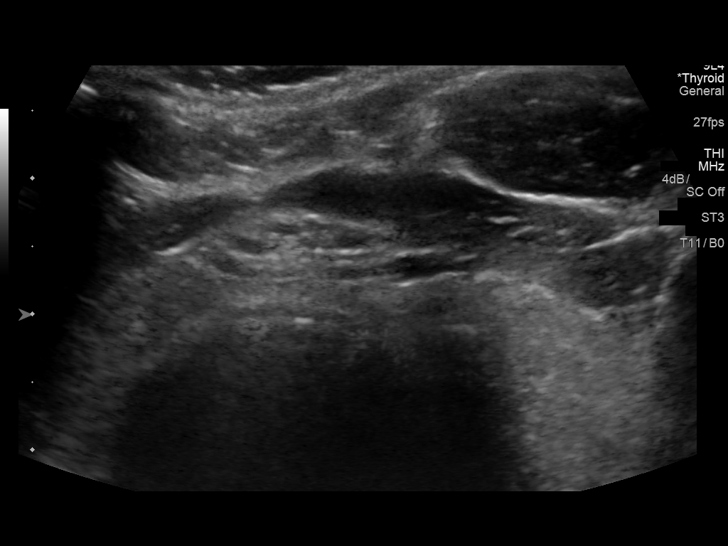
[im 7/37]
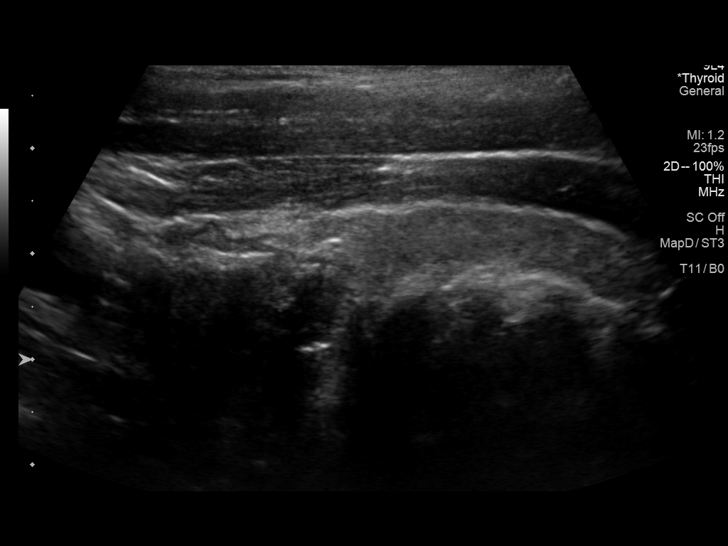
[im 10/37]
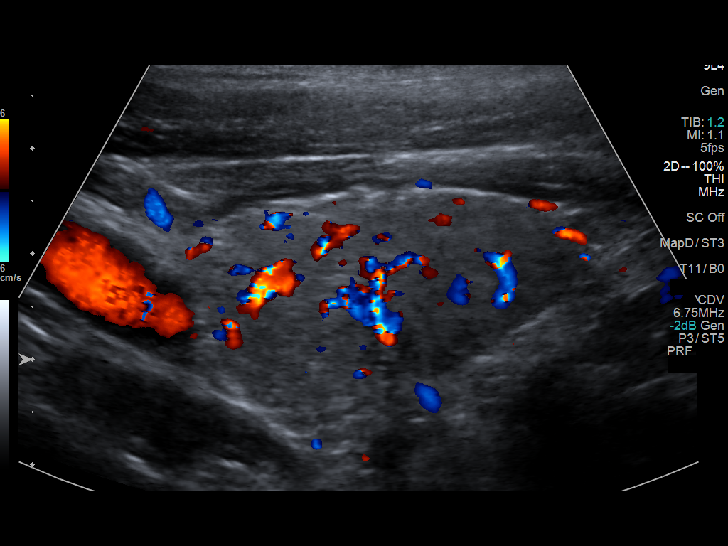
[im 13/37]
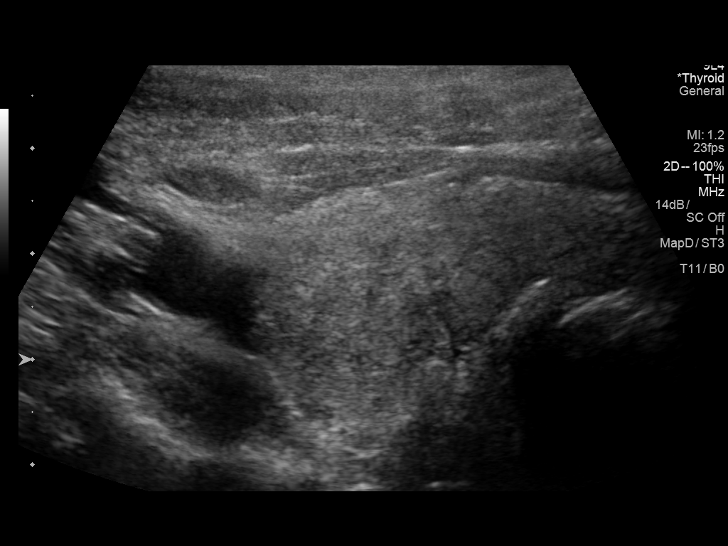
[im 16/37]
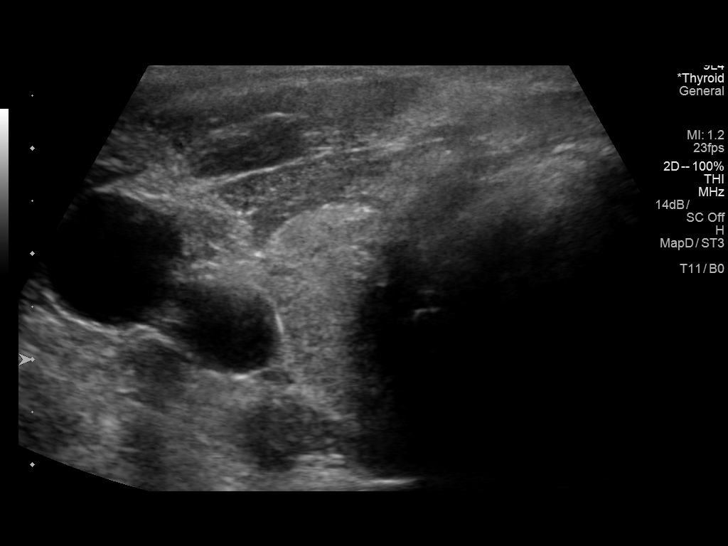
[im 19/37]
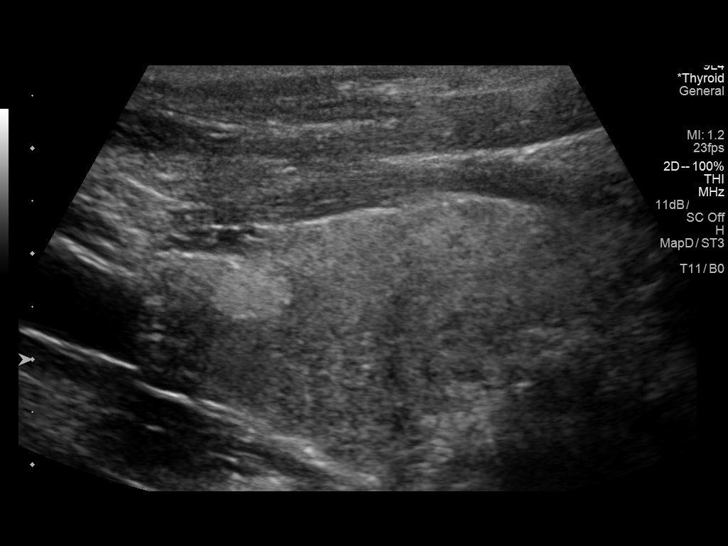
[im 22/37]
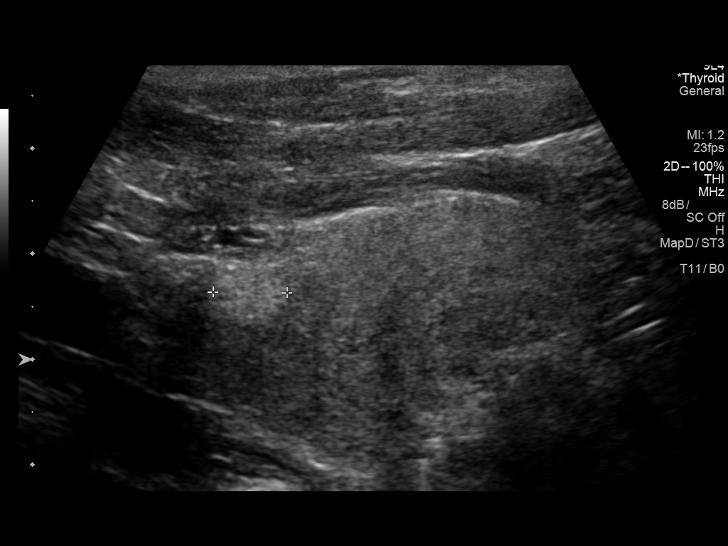
[im 25/37]
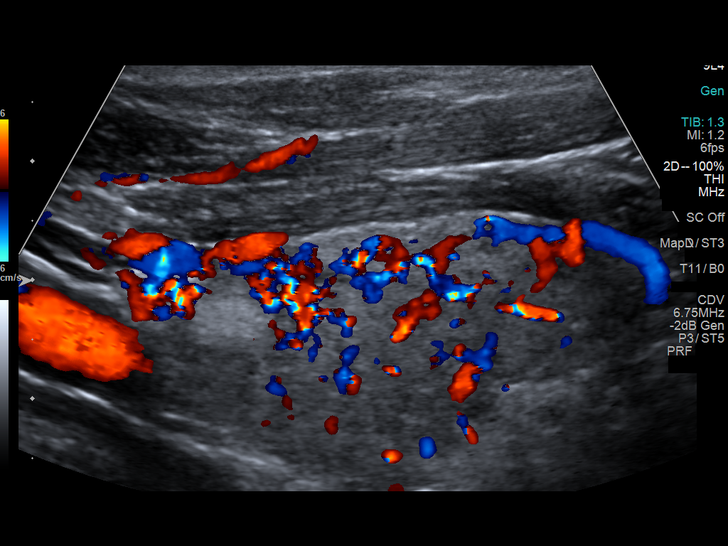
[im 28/37]
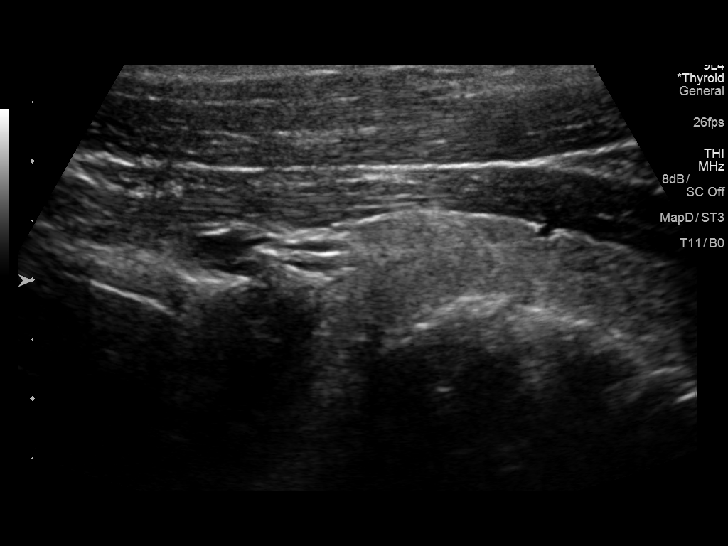
[im 31/37]
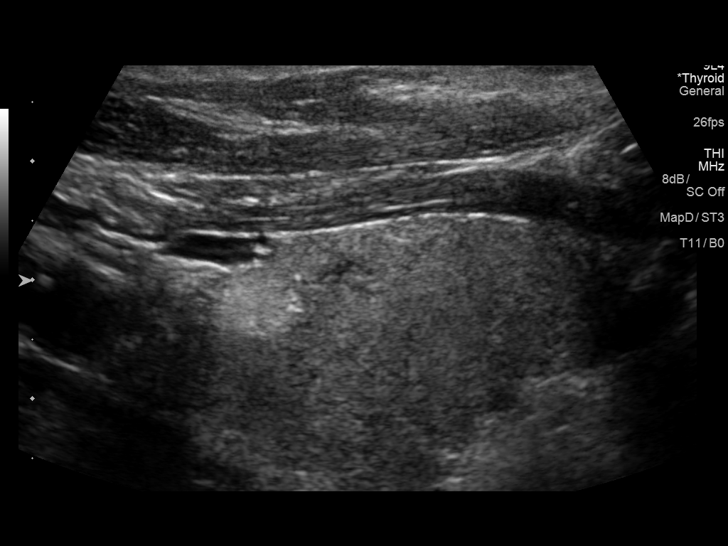
[im 34/37]
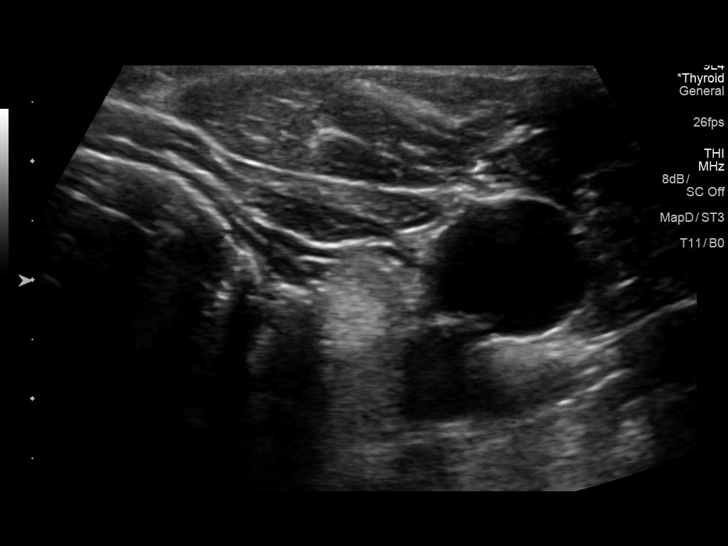
[im 37/37]
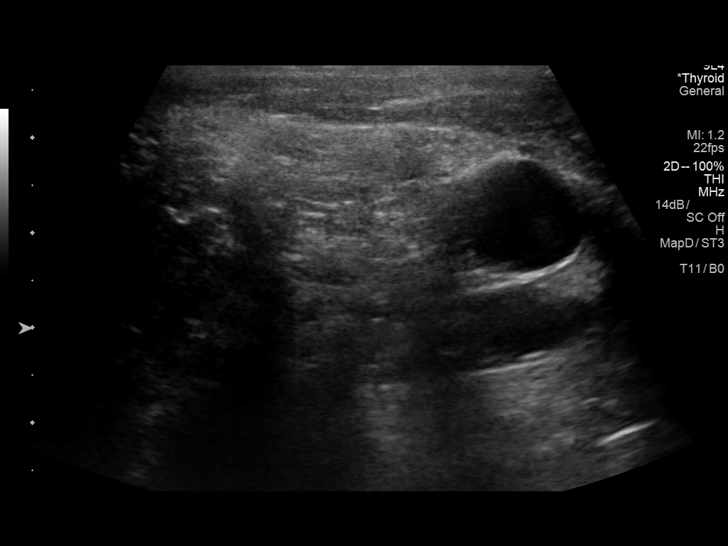

[13 of 25 positions shown; findings below may reference images not displayed]

FINDINGS: Parenchymal Echotexture: Mildly heterogenous

Isthmus: 0.5 cm

Right lobe: 4.2 x 2.1 x 1.7 cm

Left lobe: 4.8 x 2.5 x 1.2 cm

_________________________________________________________

Estimated total number of nodules >/= 1 cm: 0

Number of spongiform nodules >/=  2 cm not described below (TR1): 0

Number of mixed cystic and solid nodules >/= 1.5 cm not described
below (TR2): 0

_________________________________________________________

No discrete nodules of consequence are seen within the thyroid
gland. Incidental note is made of a echogenic solid nodule in the
left superior gland measuring 0.7 cm. This is most consistent with a
focus of active thyroiditis.
IMPRESSION: 1. Mildly heterogeneous thyroid gland suggesting chronic
thyroiditis.
2. An echogenic nodule in the left superior gland is most consistent
with a focus of active thyroiditis. No dedicated follow-up is
required for this finding.
The above is in keeping with the ACR TI-RADS recommendations - [HOSPITAL] 8028;[DATE].

## 2019-01-15 ENCOUNTER — Encounter (HOSPITAL_COMMUNITY): Payer: Self-pay | Admitting: Emergency Medicine

## 2019-01-15 ENCOUNTER — Other Ambulatory Visit: Payer: Self-pay

## 2019-01-15 ENCOUNTER — Emergency Department (HOSPITAL_COMMUNITY)
Admission: EM | Admit: 2019-01-15 | Discharge: 2019-01-15 | Disposition: A | Discharge status: Home / Self Care | Payer: Managed Care, Other (non HMO) | Attending: Emergency Medicine | Admitting: Emergency Medicine

## 2019-01-15 DIAGNOSIS — R300 Dysuria: Secondary | ICD-10-CM | POA: Diagnosis not present

## 2019-01-15 DIAGNOSIS — R350 Frequency of micturition: Secondary | ICD-10-CM | POA: Diagnosis not present

## 2019-01-15 DIAGNOSIS — R319 Hematuria, unspecified: Secondary | ICD-10-CM | POA: Diagnosis present

## 2019-01-15 DIAGNOSIS — N3001 Acute cystitis with hematuria: Secondary | ICD-10-CM | POA: Diagnosis not present

## 2019-01-15 LAB — URINALYSIS, ROUTINE W REFLEX MICROSCOPIC
Bilirubin Urine: NEGATIVE
Glucose, UA: NEGATIVE mg/dL
Ketones, ur: NEGATIVE mg/dL
Nitrite: NEGATIVE
Protein, ur: 100 mg/dL — AB
RBC / HPF: >50 RBC/hpf — ABNORMAL HIGH (ref 0–5)
Specific Gravity, Urine: 1.02 (ref 1.005–1.030)
WBC, UA: >50 WBC/hpf — ABNORMAL HIGH (ref 0–5)
pH: 5 (ref 5.0–8.0)

## 2019-01-15 LAB — COMPREHENSIVE METABOLIC PANEL
ALT: 19 U/L (ref 0–44)
AST: 15 U/L (ref 15–41)
Albumin: 3.9 g/dL (ref 3.5–5.0)
Alkaline Phosphatase: 85 U/L (ref 38–126)
Anion gap: 10 (ref 5–15)
BUN: 13 mg/dL (ref 8–23)
CO2: 22 mmol/L (ref 22–32)
Calcium: 8.8 mg/dL — ABNORMAL LOW (ref 8.9–10.3)
Chloride: 107 mmol/L (ref 98–111)
Creatinine, Ser: 1.05 mg/dL (ref 0.61–1.24)
GFR calc Af Amer: >60 mL/min (ref >60)
GFR calc non Af Amer: >60 mL/min (ref >60)
Glucose, Bld: 100 mg/dL — ABNORMAL HIGH (ref 70–99)
Potassium: 4 mmol/L (ref 3.5–5.1)
Sodium: 139 mmol/L (ref 135–145)
Total Bilirubin: 2.2 mg/dL — ABNORMAL HIGH (ref 0.3–1.2)
Total Protein: 6.3 g/dL — ABNORMAL LOW (ref 6.5–8.1)

## 2019-01-15 LAB — CBC WITH DIFFERENTIAL/PLATELET
Abs Immature Granulocytes: 0.03 10*3/uL (ref 0.00–0.07)
Basophils Absolute: 0 10*3/uL (ref 0.0–0.1)
Basophils Relative: 0 %
Eosinophils Absolute: 0 10*3/uL (ref 0.0–0.5)
Eosinophils Relative: 0 %
HCT: 40.9 % (ref 39.0–52.0)
Hemoglobin: 13.7 g/dL (ref 13.0–17.0)
Immature Granulocytes: 0 %
Lymphocytes Relative: 10 %
Lymphs Abs: 1.1 10*3/uL (ref 0.7–4.0)
MCH: 31.6 pg (ref 26.0–34.0)
MCHC: 33.5 g/dL (ref 30.0–36.0)
MCV: 94.2 fL (ref 80.0–100.0)
Monocytes Absolute: 0.5 10*3/uL (ref 0.1–1.0)
Monocytes Relative: 5 %
Neutro Abs: 9.2 10*3/uL — ABNORMAL HIGH (ref 1.7–7.7)
Neutrophils Relative %: 85 %
Platelets: 185 10*3/uL (ref 150–400)
RBC: 4.34 MIL/uL (ref 4.22–5.81)
RDW: 12 % (ref 11.5–15.5)
WBC: 11 10*3/uL — ABNORMAL HIGH (ref 4.0–10.5)
nRBC: 0 % (ref 0.0–0.2)

## 2019-01-15 MED ORDER — ACETAMINOPHEN 500 MG PO TABS: 500.0000 mg | ORAL_TABLET | Freq: Four times a day (QID) | ORAL | 0 refills | Status: DC | PRN

## 2019-01-15 MED ORDER — CIPROFLOXACIN HCL 500 MG PO TABS: 500.0000 mg | ORAL_TABLET | Freq: Two times a day (BID) | ORAL | 0 refills | Status: AC

## 2019-01-15 NOTE — ED Notes (Signed)
Pt discharge instructions reviewed. Pt verbalized understanding of discharge instructions and prescriptions. Pt discharged.

## 2019-01-15 NOTE — ED Provider Notes (Signed)
[facility] EMERGENCY DEPARTMENT Provider Note   CSN: [id number] Arrival date & time: 01/15/19  8832     History   Chief Complaint Chief Complaint  Patient presents with  . Hematuria    HPI Zachary Duncan is a 68 y.o. male with no significant past medical history who presents to the ED with reports of increased urinary frequency x2 days.  He also reports hematuria and mild dysuria beginning today which prompted him to come into the ER for evaluation.  He denies any abdominal pain, back pain, chest pain, shortness of breath, dizziness, recent illness, fevers or chills, leg swelling, change in bowel habits, or any other symptoms.  He has not taken anything for his symptoms.  He denies any history of kidney stones or UTI.  No smoking history or personal history of cancer.     HPI  Past Medical History:  Diagnosis Date  . Dermatophytosis of nail 05/10/2007  . Hyperlipidemia     Patient Active Problem List   Diagnosis Date Noted  . Elevated TSH 07/15/2016  . Routine general medical examination at a health care facility 09/26/2014  . Hyperlipidemia   . Finger joint swelling 10/16/2010  . Dermatophytosis of nail 05/10/2007    Past Surgical History:  Procedure Laterality Date  . COLONOSCOPY W/ BIOPSIES AND POLYPECTOMY          Home Medications    Prior to Admission medications   Medication Sig Start Date End Date Taking? Authorizing Provider  Carbamide Peroxide-Saline (EAR WAX CLEANSING) 6.5 % KIT Use as directed. 08/17/17   Libby Maw, MD  ciprofloxacin (CIPRO) 500 MG tablet Take 1 tablet (500 mg total) by mouth every 12 (twelve) hours for 10 days. 01/15/19 01/25/19  Corena Herter, PA-C  levothyroxine (SYNTHROID, LEVOTHROID) 50 MCG tablet Take one each morning on a fasting stomach one hour prior to eating. 08/17/17   Libby Maw, MD    Family History Family History  Problem Relation Age of Onset  . Heart disease Mother   .  Heart disease Father   . Colon cancer Neg Hx   . Thyroid disease Neg Hx     Social History Social History   Tobacco Use  . Smoking status: Never Smoker  . Smokeless tobacco: Never Used  Substance Use Topics  . Alcohol use: Yes    Comment: 2-3 monthly  . Drug use: No     Allergies   Patient has no known allergies.   Review of Systems Review of Systems  All other systems reviewed and are negative.    Physical Exam Updated Vital Signs BP (!) 145/72 (BP Location: Right Arm)   Pulse (!) 53   Temp 98.4 F (36.9 C) (Oral)   Resp 20   SpO2 98%   Physical Exam Vitals signs and nursing note reviewed. Exam conducted with a chaperone present.  Constitutional:      Appearance: Normal appearance.  HENT:     Head: Normocephalic and atraumatic.  Eyes:     General: No scleral icterus.    Conjunctiva/sclera: Conjunctivae normal.  Neck:     Musculoskeletal: Normal range of motion. No neck rigidity.  Cardiovascular:     Rate and Rhythm: Normal rate and regular rhythm.     Pulses: Normal pulses.     Heart sounds: Normal heart sounds.  Pulmonary:     Effort: Pulmonary effort is normal. No respiratory distress.     Breath sounds: Normal breath sounds.  Abdominal:  General: Bowel sounds are normal. There is no distension.     Palpations: Abdomen is soft.     Tenderness: There is no abdominal tenderness. There is no right CVA tenderness, left CVA tenderness or guarding.  Skin:    General: Skin is dry.  Neurological:     Mental Status: He is alert.     GCS: GCS eye subscore is 4. GCS verbal subscore is 5. GCS motor subscore is 6.  Psychiatric:        Mood and Affect: Mood normal.        Behavior: Behavior normal.        Thought Content: Thought content normal.      ED Treatments / Results  Labs (all labs ordered are listed, but only abnormal results are displayed) Labs Reviewed  URINALYSIS, ROUTINE W REFLEX MICROSCOPIC - Abnormal; Notable for the following  components:      Result Value   Color, Urine AMBER (*)    APPearance CLOUDY (*)    Hgb urine dipstick LARGE (*)    Protein, ur 100 (*)    Leukocytes,Ua MODERATE (*)    RBC / HPF >50 (*)    WBC, UA >50 (*)    Bacteria, UA FEW (*)    Non Squamous Epithelial 0-5 (*)    All other components within normal limits  CBC WITH DIFFERENTIAL/PLATELET - Abnormal; Notable for the following components:   WBC 11.0 (*)    Neutro Abs 9.2 (*)    All other components within normal limits  COMPREHENSIVE METABOLIC PANEL - Abnormal; Notable for the following components:   Glucose, Bld 100 (*)    Calcium 8.8 (*)    Total Protein 6.3 (*)    Total Bilirubin 2.2 (*)    All other components within normal limits  URINE CULTURE    EKG None  Radiology No results found.  Procedures Procedures (including critical care time)  Medications Ordered in ED Medications - No data to display   Initial Impression / Assessment and Plan / ED Course  I have reviewed the triage vital signs and the nursing notes.  Pertinent labs & imaging results that were available during my care of the patient were reviewed by me and considered in my medical decision making (see chart for details).        Patient has history and labs that are suggestive of acute cystitis with hematuria.  Patient does not have any suprapubic abdominal discomfort or fevers or chills that would suggest possible prostatitis.  He also does not have any flank pain radiating towards the groin or history of stones that would suggest nephrolithiasis. His UA was interpreted and is consistent with an infection.  Will treat with ciprofloxacin x10 days given that he would be considered complicated UTI and will obtain urine culture.  Instructed him to follow-up with his PCP at his [facility] medical practice to review urine culture findings and determine if antibiotic adjustment is warranted.  They may also consider sending him to urology if they suspect  that this is related to his prostate.  Do not feel as though CT abdomen pelvis is warranted at this time as he denies any abdominal or low back discomfort and his vitals are reassuring.  He also only has a mild leukocytosis to 11.0 WBCs.  Prescribed patient his antibiotics and will encourage him to take Tylenol, as needed for any fever control.  Discussed with patient and he voiced understanding and is agreeable plan.  Return  to ED or seek medical attention for any new abdominal or low back discomfort, fevers or chills uncontrolled by Tylenol, inability to urinate, or any other new or worsening symptoms.  Final Clinical Impressions(s) / ED Diagnoses   Final diagnoses:  Acute cystitis with hematuria    ED Discharge Orders         Ordered    ciprofloxacin (CIPRO) 500 MG tablet  Every 12 hours     01/15/19 1225           Reita Chard 01/15/19 1232    [name] Ferguson, MD 01/15/19 1251

## 2019-01-15 NOTE — ED Triage Notes (Signed)
Reports frequent urination x 2-3 days and hematuria that started today.  Denies pain.

## 2019-01-15 NOTE — Discharge Instructions (Signed)
Please follow-up with your PCP this week to review findings of urine culture determine whether or not change in antibiotic is warranted.  Also to consider whether or not should be referred to a urologist for evaluation of your prostate.  Please take your ciprofloxacin, as prescribed.  Take Tylenol should you develop any fevers or chills.  Be sure to hydrate.  Return to ED or seek medical attention for any new abdominal or low back discomfort, fevers or chills uncontrolled by Tylenol, inability to urinate, or any other new or worsening symptoms.

## 2019-01-16 LAB — URINE CULTURE: Culture: <10000 — AB

## 2019-01-23 ENCOUNTER — Encounter: Payer: Self-pay | Admitting: Family Medicine

## 2019-01-23 ENCOUNTER — Ambulatory Visit: Payer: Managed Care, Other (non HMO) | Admitting: Family Medicine

## 2019-01-23 ENCOUNTER — Other Ambulatory Visit: Payer: Self-pay

## 2019-01-23 VITALS — BP 120/70 | HR 55 | Ht 70.5 in | Wt 184.5 lb

## 2019-01-23 DIAGNOSIS — Z0000 Encounter for general adult medical examination without abnormal findings: Secondary | ICD-10-CM | POA: Diagnosis not present

## 2019-01-23 DIAGNOSIS — Z87440 Personal history of urinary (tract) infections: Secondary | ICD-10-CM | POA: Diagnosis not present

## 2019-01-23 DIAGNOSIS — E039 Hypothyroidism, unspecified: Secondary | ICD-10-CM

## 2019-01-23 DIAGNOSIS — H6123 Impacted cerumen, bilateral: Secondary | ICD-10-CM

## 2019-01-23 DIAGNOSIS — Z23 Encounter for immunization: Secondary | ICD-10-CM | POA: Diagnosis not present

## 2019-01-23 MED ORDER — EAR WAX CLEANSING 6.5 % OT KIT: PACK | OTIC | 2 refills | Status: DC

## 2019-01-23 NOTE — Patient Instructions (Signed)
Earwax Buildup, Adult The ears produce a substance called earwax that helps keep bacteria out of the ear and protects the skin in the ear canal. Occasionally, earwax can build up in the ear and cause discomfort or hearing loss. What increases the risk? This condition is more likely to develop in people who:  Are male.  Are elderly.  Naturally produce more earwax.  Clean their ears often with cotton swabs.  Use earplugs often.  Use in-ear headphones often.  Wear hearing aids.  Have narrow ear canals.  Have earwax that is overly thick or sticky.  Have eczema.  Are dehydrated.  Have excess hair in the ear canal. What are the signs or symptoms? Symptoms of this condition include:  Reduced or muffled hearing.  A feeling of fullness in the ear or feeling that the ear is plugged.  Fluid coming from the ear.  Ear pain.  Ear itch.  Ringing in the ear.  Coughing.  An obvious piece of earwax that can be seen inside the ear canal. How is this diagnosed? This condition may be diagnosed based on:  Your symptoms.  Your medical history.  An ear exam. During the exam, your health care provider will look into your ear with an instrument called an otoscope. You may have tests, including a hearing test. How is this treated? This condition may be treated by:  Using ear drops to soften the earwax.  Having the earwax removed by a health care provider. The health care provider may: ? Flush the ear with water. ? Use an instrument that has a loop on the end (curette). ? Use a suction device.  Surgery to remove the wax buildup. This may be done in severe cases. Follow these instructions at home:   Take over-the-counter and prescription medicines only as told by your health care provider.  Do not put any objects, including cotton swabs, into your ear. You can clean the opening of your ear canal with a washcloth or facial tissue.  Follow instructions from your health care  provider about cleaning your ears. Do not over-clean your ears.  Drink enough fluid to keep your urine clear or pale yellow. This will help to thin the earwax.  Keep all follow-up visits as told by your health care provider. If earwax builds up in your ears often or if you use hearing aids, consider seeing your health care provider for routine, preventive ear cleanings. Ask your health care provider how often you should schedule your cleanings.  If you have hearing aids, clean them according to instructions from the manufacturer and your health care provider. Contact a health care provider if:  You have ear pain.  You develop a fever.  You have blood, pus, or other fluid coming from your ear.  You have hearing loss.  You have ringing in your ears that does not go away.  Your symptoms do not improve with treatment.  You feel like the room is spinning (vertigo). Summary  Earwax can build up in the ear and cause discomfort or hearing loss.  The most common symptoms of this condition include reduced or muffled hearing and a feeling of fullness in the ear or feeling that the ear is plugged.  This condition may be diagnosed based on your symptoms, your medical history, and an ear exam.  This condition may be treated by using ear drops to soften the earwax or by having the earwax removed by a health care provider.  Do not put any   objects, including cotton swabs, into your ear. You can clean the opening of your ear canal with a washcloth or facial tissue. This information is not intended to replace advice given to you by your health care provider. Make sure you discuss any questions you have with your health care provider. Document Released: 04/09/2004 Document Revised: 02/12/2017 Document Reviewed: 05/13/2016 Elsevier Patient Education  2020 [place], Adult Your doctor has found that you have a condition that requires you to use ear drops. Ear drops are a medicine that  is placed in the ear. This sheet gives you information about how to use this medicine. Your doctor may also give you more specific instructions. Supplies needed:  Cotton ball.  Medicine. How to put ear drops into your ear  1. Wash your hands with soap and water. 2. Make sure your ears are clean and dry. 3. Warm the medicine by holding it in your hand for a few minutes. 4. Shake the medicine to mix the ear drops. 5. Use the tube to get the medicine. You will need to squeeze the round part of the tube to do this. 6. Put the drops in your ear as told. Hold the tube above your ear. Do not let the tube touch your ear. The medicine may go in easier if you pull the flap of your ear up and back while you put the drops in. 7. To make sure your ear soaks up the medicine, do one of these things: ? Lie down for 10 minutes. The ear with the medicine should face up. ? Put a cotton ball in your ear. Do not push it deeper into your ear. Take out the cotton ball when the drops have been soaked up. 8. If you need to put drops in your other ear, repeat the same steps. Your doctor will tell you if you should put drops in both ears. Follow these instructions at home:  Use the ear drops for as long as your doctor tells you to. Do not stop even if your symptoms get better.  Keep the ear drops at room temperature.  Keep all follow-up visits as told by your doctor. This is important. Contact a doctor if:  Your condition gets worse.  Your pain gets worse.  Unusual fluid (drainage) is coming from your ear, especially if the fluid stinks.  You have trouble hearing. Get help right away if:  You feel like the room is spinning and you feel sick to your stomach. This condition is called vertigo.  The outside of your ear becomes red or swollen.  You have a very bad headache. Summary  Ear drops are a medicine that is put in the ear.  Put the drops in your ear as told by your doctor.  Use the ear drops  for as long as your doctor tells you to. Do not stop even if your symptoms get better. This information is not intended to replace advice given to you by your health care provider. Make sure you discuss any questions you have with your health care provider. Document Released: 08/20/2009 Document Revised: 02/12/2017 Document Reviewed: 03/06/2016 Elsevier Patient Education  2020 [employer].

## 2019-01-23 NOTE — Progress Notes (Signed)
Established Patient Office Visit  Subjective:  Patient ID: Zachary Duncan, male    DOB: August 16, 1950  Age: 67 y.o. MRN: [id number]  CC:  Chief Complaint  Patient presents with  . Hospitalization Follow-up    HPI Zachary Duncan presents for follow-up status post recent ER visit for an apparent UTI.  Symptoms started 9 days ago with urinary frequency urgency and hematuria.  There have been some mild dysuria.  He was seen in the emergency room diagnosed with UTI.  Chart review revealed slightly elevated blood count with normal hemoglobin, urinalysis with blood and WBCs.  There were a few bacteria.  Culture was essentially negative.  Patient has never smoked he tells me.  Patient symptoms cleared after he took Cipro for 7 days.  Patient has a history of hypothyroidism and was lost to follow-up.  He said that he had developed a strange sensation on his chest wall after taking the thyroid medicine.  And he discontinued it.  Past Medical History:  Diagnosis Date  . Dermatophytosis of nail 05/10/2007  . Hyperlipidemia     Past Surgical History:  Procedure Laterality Date  . COLONOSCOPY W/ BIOPSIES AND POLYPECTOMY      Family History  Problem Relation Age of Onset  . Heart disease Mother   . Heart disease Father   . Colon cancer Neg Hx   . Thyroid disease Neg Hx     Social History   Socioeconomic History  . Marital status: Divorced    Spouse name: Not on file  . Number of children: 1  . Years of education: 65  . Highest education level: Not on file  Occupational History  . Occupation: Lexicographer  Social Needs  . Financial resource strain: Not on file  . Food insecurity    Worry: Not on file    Inability: Not on file  . Transportation needs    Medical: Not on file    Non-medical: Not on file  Tobacco Use  . Smoking status: Never Smoker  . Smokeless tobacco: Never Used  Substance and Sexual Activity  . Alcohol use: Yes    Comment: 2-3 monthly  . Drug use: No   . Sexual activity: Not on file  Lifestyle  . Physical activity    Days per week: Not on file    Minutes per session: Not on file  . Stress: Not on file  Relationships  . Social Herbalist on phone: Not on file    Gets together: Not on file    Attends religious service: Not on file    Active member of club or organization: Not on file    Attends meetings of clubs or organizations: Not on file    Relationship status: Not on file  . Intimate partner violence    Fear of current or ex partner: Not on file    Emotionally abused: Not on file    Physically abused: Not on file    Forced sexual activity: Not on file  Other Topics Concern  . Not on file  Social History Narrative   Fun: Tennis when able    Denies religious beliefs effecting health care.     Outpatient Medications Prior to Visit  Medication Sig Dispense Refill  . acetaminophen (TYLENOL) 500 MG tablet Take 1 tablet (500 mg total) by mouth every 6 (six) hours as needed. 30 tablet 0  . ciprofloxacin (CIPRO) 500 MG tablet Take 1 tablet (500 mg total) by mouth  every 12 (twelve) hours for 10 days. 20 tablet 0  . levothyroxine (SYNTHROID, LEVOTHROID) 50 MCG tablet Take one each morning on a fasting stomach one hour prior to eating. 90 tablet 0  . Carbamide Peroxide-Saline (EAR WAX CLEANSING) 6.5 % KIT Use as directed. 1 kit 1   No facility-administered medications prior to visit.     No Known Allergies  ROS Review of Systems  Constitutional: Negative.   HENT: Positive for ear discharge and hearing loss. Negative for ear pain.   Eyes: Negative for photophobia and visual disturbance.  Respiratory: Negative.   Cardiovascular: Negative.   Gastrointestinal: Negative.   Endocrine: Negative for cold intolerance and heat intolerance.  Genitourinary: Negative for difficulty urinating, frequency, hematuria and urgency.  Musculoskeletal: Negative for gait problem.  Skin: Negative for pallor and rash.   Allergic/Immunologic: Negative for immunocompromised state.  Neurological: Negative for light-headedness and numbness.  Hematological: Does not bruise/bleed easily.  Psychiatric/Behavioral: Negative.       Objective:    Physical Exam  Constitutional: He is oriented to person, place, and time. He appears well-developed and well-nourished. No distress.  HENT:  Head: Normocephalic and atraumatic.  Right Ear: External ear normal. A foreign body is present.  Left Ear: External ear normal. A foreign body is present.  Ears:  Mouth/Throat: Oropharynx is clear and moist. No oropharyngeal exudate.  Eyes: Pupils are equal, round, and reactive to light. Conjunctivae are normal. Right eye exhibits no discharge. Left eye exhibits no discharge. No scleral icterus.  Neck: No JVD present. No tracheal deviation present.  Cardiovascular: Normal rate, regular rhythm and normal heart sounds.  Pulmonary/Chest: Effort normal and breath sounds normal.  Musculoskeletal:        General: No edema.  Neurological: He is alert and oriented to person, place, and time.  Skin: Skin is warm and dry. He is not diaphoretic.  Psychiatric: He has a normal mood and affect.    BP 120/70   Pulse (!) 55   Ht 5' 10.5" (1.791 m)   Wt 184 lb 8 oz (83.7 kg)   SpO2 98%   BMI 26.10 kg/m  Wt Readings from Last 3 Encounters:  01/23/19 184 lb 8 oz (83.7 kg)  08/17/17 191 lb 6 oz (86.8 kg)  09/23/16 194 lb (88 kg)   BP Readings from Last 3 Encounters:  01/23/19 120/70  01/15/19 (!) 145/72  08/17/17 128/80   Guideline developer:  UpToDate (see UpToDate for funding source) Date Released: June 2014  Health Maintenance Due  Topic Date Due  . Samul Dada  10/29/1969  . PNA vac Low Risk Adult (1 of 2 - PCV13) 10/30/2015    There are no preventive care reminders to display for this patient.  Lab Results  Component Value Date   TSH 9.37 (H) 08/17/2017   Lab Results  Component Value Date   WBC 11.0 (H)  01/15/2019   HGB 13.7 01/15/2019   HCT 40.9 01/15/2019   MCV 94.2 01/15/2019   PLT 185 01/15/2019   Lab Results  Component Value Date   NA 139 01/15/2019   K 4.0 01/15/2019   CO2 22 01/15/2019   GLUCOSE 100 (H) 01/15/2019   BUN 13 01/15/2019   CREATININE 1.05 01/15/2019   BILITOT 2.2 (H) 01/15/2019   ALKPHOS 85 01/15/2019   AST 15 01/15/2019   ALT 19 01/15/2019   PROT 6.3 (L) 01/15/2019   ALBUMIN 3.9 01/15/2019   CALCIUM 8.8 (L) 01/15/2019   ANIONGAP 10 01/15/2019   GFR  83.09 08/17/2017   Lab Results  Component Value Date   CHOL 183 08/17/2017   Lab Results  Component Value Date   HDL 33.60 (L) 08/17/2017   Lab Results  Component Value Date   LDLCALC 125 (H) 08/17/2017   Lab Results  Component Value Date   TRIG 126.0 08/17/2017   Lab Results  Component Value Date   CHOLHDL 5 08/17/2017   No results found for: HGBA1C    Assessment & Plan:   Problem List Items Addressed This Visit      Nervous and Auditory   Excessive cerumen in both ear canals   Relevant Medications   Carbamide Peroxide-Saline (EAR WAX CLEANSING) 6.5 % KIT     Genitourinary   History of UTI - Primary   Relevant Orders   CBC   Comp Met (CMET)   Urinalysis, Routine w reflex microscopic   PSA    Other Visit Diagnoses    Healthcare maintenance       Relevant Orders   CBC   Comp Met (CMET)   Lipid Profile   Urinalysis, Routine w reflex microscopic   Hypothyroidism, unspecified type       Relevant Orders   TSH   Need for influenza vaccination       Relevant Orders   Flu Vaccine QUAD High Dose(Fluad) (Completed)      Meds ordered this encounter  Medications  . Carbamide Peroxide-Saline (EAR WAX CLEANSING) 6.5 % KIT    Sig: Follow kit directions.    Dispense:  1 kit    Refill:  2    Follow-up: No follow-ups on file.

## 2019-01-31 ENCOUNTER — Encounter: Payer: Managed Care, Other (non HMO) | Admitting: Family Medicine

## 2019-02-01 ENCOUNTER — Telehealth: Payer: Self-pay

## 2019-02-01 NOTE — Telephone Encounter (Signed)

## 2019-02-02 ENCOUNTER — Other Ambulatory Visit: Payer: Self-pay

## 2019-02-02 ENCOUNTER — Encounter: Payer: Self-pay | Admitting: Family Medicine

## 2019-02-02 ENCOUNTER — Ambulatory Visit (INDEPENDENT_AMBULATORY_CARE_PROVIDER_SITE_OTHER): Payer: Managed Care, Other (non HMO) | Admitting: Family Medicine

## 2019-02-02 VITALS — BP 122/70 | HR 50 | Ht 70.5 in | Wt 184.0 lb

## 2019-02-02 DIAGNOSIS — Z23 Encounter for immunization: Secondary | ICD-10-CM

## 2019-02-02 DIAGNOSIS — R972 Elevated prostate specific antigen [PSA]: Secondary | ICD-10-CM | POA: Diagnosis not present

## 2019-02-02 DIAGNOSIS — E038 Other specified hypothyroidism: Secondary | ICD-10-CM | POA: Insufficient documentation

## 2019-02-02 DIAGNOSIS — Z0000 Encounter for general adult medical examination without abnormal findings: Secondary | ICD-10-CM | POA: Diagnosis not present

## 2019-02-02 DIAGNOSIS — E039 Hypothyroidism, unspecified: Secondary | ICD-10-CM

## 2019-02-02 DIAGNOSIS — Z87440 Personal history of urinary (tract) infections: Secondary | ICD-10-CM | POA: Diagnosis not present

## 2019-02-02 LAB — URINALYSIS, ROUTINE W REFLEX MICROSCOPIC
Bilirubin Urine: NEGATIVE
Hgb urine dipstick: NEGATIVE
Ketones, ur: NEGATIVE
Leukocytes,Ua: NEGATIVE
Nitrite: NEGATIVE
RBC / HPF: NONE SEEN (ref 0–?)
Specific Gravity, Urine: 1.015 (ref 1.000–1.030)
Total Protein, Urine: NEGATIVE
Urine Glucose: NEGATIVE
Urobilinogen, UA: 0.2 (ref 0.0–1.0)
pH: 6.5 (ref 5.0–8.0)

## 2019-02-02 LAB — LIPID PANEL
Cholesterol: 176 mg/dL (ref 0–200)
HDL: 40.2 mg/dL (ref >39.00)
LDL Cholesterol: 115 mg/dL — ABNORMAL HIGH (ref 0–99)
NonHDL: 135.45
Total CHOL/HDL Ratio: 4
Triglycerides: 103 mg/dL (ref 0.0–149.0)
VLDL: 20.6 mg/dL (ref 0.0–40.0)

## 2019-02-02 LAB — COMPREHENSIVE METABOLIC PANEL
ALT: 11 U/L (ref 0–53)
AST: 11 U/L (ref 0–37)
Albumin: 4.5 g/dL (ref 3.5–5.2)
Alkaline Phosphatase: 94 U/L (ref 39–117)
BUN: 13 mg/dL (ref 6–23)
CO2: 29 mEq/L (ref 19–32)
Calcium: 9.1 mg/dL (ref 8.4–10.5)
Chloride: 104 mEq/L (ref 96–112)
Creatinine, Ser: 0.97 mg/dL (ref 0.40–1.50)
GFR: 76.91 mL/min (ref >60.00)
Glucose, Bld: 103 mg/dL — ABNORMAL HIGH (ref 70–99)
Potassium: 4.2 mEq/L (ref 3.5–5.1)
Sodium: 140 mEq/L (ref 135–145)
Total Bilirubin: 1.6 mg/dL — ABNORMAL HIGH (ref 0.2–1.2)
Total Protein: 6.5 g/dL (ref 6.0–8.3)

## 2019-02-02 LAB — PSA: PSA: 6.07 ng/mL — ABNORMAL HIGH (ref 0.10–4.00)

## 2019-02-02 LAB — CBC
HCT: 41.7 % (ref 39.0–52.0)
Hemoglobin: 14.3 g/dL (ref 13.0–17.0)
MCHC: 34.4 g/dL (ref 30.0–36.0)
MCV: 92.1 fl (ref 78.0–100.0)
Platelets: 227 10*3/uL (ref 150.0–400.0)
RBC: 4.52 Mil/uL (ref 4.22–5.81)
RDW: 12.8 % (ref 11.5–15.5)
WBC: 5.9 10*3/uL (ref 4.0–10.5)

## 2019-02-02 LAB — TSH: TSH: 7.42 u[IU]/mL — ABNORMAL HIGH (ref 0.35–4.50)

## 2019-02-02 MED ORDER — LIOTHYRONINE SODIUM 25 MCG PO TABS: 25.0000 ug | ORAL_TABLET | Freq: Every day | ORAL | 2 refills | Status: DC

## 2019-02-02 NOTE — Addendum Note (Signed)
Addended by: Jon Billings on: 02/02/2019 04:07 PM   Modules accepted: Orders

## 2019-02-02 NOTE — Patient Instructions (Signed)
Preventive Care 68 Years and Older, Male Preventive care refers to lifestyle choices and visits with your health care provider that can promote health and wellness. This includes:  A yearly physical exam. This is also called an annual well check.  Regular dental and eye exams.  Immunizations.  Screening for certain conditions.  Healthy lifestyle choices, such as diet and exercise. What can I expect for my preventive care visit? Physical exam Your health care provider will check:  Height and weight. These may be used to calculate body mass index (BMI), which is a measurement that tells if you are at a healthy weight.  Heart rate and blood pressure.  Your skin for abnormal spots. Counseling Your health care provider may ask you questions about:  Alcohol, tobacco, and drug use.  Emotional well-being.  Home and relationship well-being.  Sexual activity.  Eating habits.  History of falls.  Memory and ability to understand (cognition).  Work and work Statistician. What immunizations do I need?  Influenza (flu) vaccine  This is recommended every year. Tetanus, diphtheria, and pertussis (Tdap) vaccine  You may need a Td booster every 10 years. Varicella (chickenpox) vaccine  You may need this vaccine if you have not already been vaccinated. Zoster (shingles) vaccine  You may need this after age 68. Pneumococcal conjugate (PCV13) vaccine  One dose is recommended after age 68. Pneumococcal polysaccharide (PPSV23) vaccine  One dose is recommended after age 68. Measles, mumps, and rubella (MMR) vaccine  You may need at least one dose of MMR if you were born in 1957 or later. You may also need a second dose. Meningococcal conjugate (MenACWY) vaccine  You may need this if you have certain conditions. Hepatitis A vaccine  You may need this if you have certain conditions or if you travel or work in places where you may be exposed to hepatitis A. Hepatitis B vaccine   You may need this if you have certain conditions or if you travel or work in places where you may be exposed to hepatitis B. Haemophilus influenzae type b (Hib) vaccine  You may need this if you have certain conditions. You may receive vaccines as individual doses or as more than one vaccine together in one shot (combination vaccines). Talk with your health care provider about the risks and benefits of combination vaccines. What tests do I need? Blood tests  Lipid and cholesterol levels. These may be checked every 5 years, or more frequently depending on your overall health.  Hepatitis C test.  Hepatitis B test. Screening  Lung cancer screening. You may have this screening every year starting at age 74 if you have a 30-pack-year history of smoking and currently smoke or have quit within the past 15 years.  Colorectal cancer screening. All adults should have this screening starting at age 68 and continuing until age 54. Your health care provider may recommend screening at age 68 if you are at increased risk. You will have tests every 1-10 years, depending on your results and the type of screening test.  Prostate cancer screening. Recommendations will vary depending on your family history and other risks.  Diabetes screening. This is done by checking your blood sugar (glucose) after you have not eaten for a while (fasting). You may have this done every 1-3 years.  Abdominal aortic aneurysm (AAA) screening. You may need this if you are a current or former smoker.  Sexually transmitted disease (STD) testing. Follow these instructions at home: Eating and drinking  Eat  a diet that includes fresh fruits and vegetables, whole grains, lean protein, and low-fat dairy products. Limit your intake of foods with high amounts of sugar, saturated fats, and salt.  Take vitamin and mineral supplements as recommended by your health care provider.  Do not drink alcohol if your health care provider  tells you not to drink.  If you drink alcohol: ? Limit how much you have to 0-2 drinks a day. ? Be aware of how much alcohol is in your drink. In the [place] one drink equals one 12 oz bottle of beer (355 mL), one 5 oz glass of wine (148 mL), or one 1 oz glass of hard liquor (44 mL). Lifestyle  Take daily care of your teeth and gums.  Stay active. Exercise for at least 30 minutes on 5 or more days each week.  Do not use any products that contain nicotine or tobacco, such as cigarettes, e-cigarettes, and chewing tobacco. If you need help quitting, ask your health care provider.  If you are sexually active, practice safe sex. Use a condom or other form of protection to prevent STIs (sexually transmitted infections).  Talk with your health care provider about taking a low-dose aspirin or statin. What's next?  Visit your health care provider once a year for a well check visit.  Ask your health care provider how often you should have your eyes and teeth checked.  Stay up to date on all vaccines. This information is not intended to replace advice given to you by your health care provider. Make sure you discuss any questions you have with your health care provider. Document Released: 03/29/2015 Document Revised: 02/24/2018 Document Reviewed: 02/24/2018 Elsevier Patient Education  2020 [place] Maintenance After Age 68 After age 68, you are at a higher risk for certain long-term diseases and infections as well as injuries from falls. Falls are a major cause of broken bones and head injuries in people who are older than age 68. Getting regular preventive care can help to keep you healthy and well. Preventive care includes getting regular testing and making lifestyle changes as recommended by your health care provider. Talk with your health care provider about:  Which screenings and tests you should have. A screening is a test that checks for a disease when you have no symptoms.  A  diet and exercise plan that is right for you. What should I know about screenings and tests to prevent falls? Screening and testing are the best ways to find a health problem early. Early diagnosis and treatment give you the best chance of managing medical conditions that are common after age 40. Certain conditions and lifestyle choices may make you more likely to have a fall. Your health care provider may recommend:  Regular vision checks. Poor vision and conditions such as cataracts can make you more likely to have a fall. If you wear glasses, make sure to get your prescription updated if your vision changes.  Medicine review. Work with your health care provider to regularly review all of the medicines you are taking, including over-the-counter medicines. Ask your health care provider about any side effects that may make you more likely to have a fall. Tell your health care provider if any medicines that you take make you feel dizzy or sleepy.  Osteoporosis screening. Osteoporosis is a condition that causes the bones to get weaker. This can make the bones weak and cause them to break more easily.  Blood pressure screening. Blood pressure  changes and medicines to control blood pressure can make you feel dizzy.  Strength and balance checks. Your health care provider may recommend certain tests to check your strength and balance while standing, walking, or changing positions.  Foot health exam. Foot pain and numbness, as well as not wearing proper footwear, can make you more likely to have a fall.  Depression screening. You may be more likely to have a fall if you have a fear of falling, feel emotionally low, or feel unable to do activities that you used to do.  Alcohol use screening. Using too much alcohol can affect your balance and may make you more likely to have a fall. What actions can I take to lower my risk of falls? General instructions  Talk with your health care provider about your  risks for falling. Tell your health care provider if: ? You fall. Be sure to tell your health care provider about all falls, even ones that seem minor. ? You feel dizzy, sleepy, or off-balance.  Take over-the-counter and prescription medicines only as told by your health care provider. These include any supplements.  Eat a healthy diet and maintain a healthy weight. A healthy diet includes low-fat dairy products, low-fat (lean) meats, and fiber from whole grains, beans, and lots of fruits and vegetables. Home safety  Remove any tripping hazards, such as rugs, cords, and clutter.  Install safety equipment such as grab bars in bathrooms and safety rails on stairs.  Keep rooms and walkways well-lit. Activity   Follow a regular exercise program to stay fit. This will help you maintain your balance. Ask your health care provider what types of exercise are appropriate for you.  If you need a cane or walker, use it as recommended by your health care provider.  Wear supportive shoes that have nonskid soles. Lifestyle  Do not drink alcohol if your health care provider tells you not to drink.  If you drink alcohol, limit how much you have: ? 0-1 drink a day for women. ? 0-2 drinks a day for men.  Be aware of how much alcohol is in your drink. In the [place] one drink equals one typical bottle of beer (12 oz), one-half glass of wine (5 oz), or one shot of hard liquor (1 oz).  Do not use any products that contain nicotine or tobacco, such as cigarettes and e-cigarettes. If you need help quitting, ask your health care provider. Summary  Having a healthy lifestyle and getting preventive care can help to protect your health and wellness after age 78.  Screening and testing are the best way to find a health problem early and help you avoid having a fall. Early diagnosis and treatment give you the best chance for managing medical conditions that are more common for people who are older than age  67.  Falls are a major cause of broken bones and head injuries in people who are older than age 55. Take precautions to prevent a fall at home.  Work with your health care provider to learn what changes you can make to improve your health and wellness and to prevent falls. This information is not intended to replace advice given to you by your health care provider. Make sure you discuss any questions you have with your health care provider. Document Released: 01/13/2017 Document Revised: 06/23/2018 Document Reviewed: 01/13/2017 Elsevier Patient Education  [place].  Hypothyroidism  Hypothyroidism is when the thyroid gland does not make enough of certain hormones (it  is underactive). The thyroid gland is a small gland located in the lower front part of the neck, just in front of the windpipe (trachea). This gland makes hormones that help control how the body uses food for energy (metabolism) as well as how the heart and brain function. These hormones also play a role in keeping your bones strong. When the thyroid is underactive, it produces too little of the hormones thyroxine (T4) and triiodothyronine (T3). What are the causes? This condition may be caused by:  Hashimoto's disease. This is a disease in which the body's disease-fighting system (immune system) attacks the thyroid gland. This is the most common cause.  Viral infections.  Pregnancy.  Certain medicines.  Birth defects.  Past radiation treatments to the head or neck for cancer.  Past treatment with radioactive iodine.  Past exposure to radiation in the environment.  Past surgical removal of part or all of the thyroid.  Problems with a gland in the center of the brain (pituitary gland).  Lack of enough iodine in the diet. What increases the risk? You are more likely to develop this condition if:  You are male.  You have a family history of thyroid conditions.  You use a medicine called lithium.  You  take medicines that affect the immune system (immunosuppressants). What are the signs or symptoms? Symptoms of this condition include:  Feeling as though you have no energy (lethargy).  Not being able to tolerate cold.  Weight gain that is not explained by a change in diet or exercise habits.  Lack of appetite.  Dry skin.  Coarse hair.  Menstrual irregularity.  Slowing of thought processes.  Constipation.  Sadness or depression. How is this diagnosed? This condition may be diagnosed based on:  Your symptoms, your medical history, and a physical exam.  Blood tests. You may also have imaging tests, such as an ultrasound or MRI. How is this treated? This condition is treated with medicine that replaces the thyroid hormones that your body does not make. After you begin treatment, it may take several weeks for symptoms to go away. Follow these instructions at home:  Take over-the-counter and prescription medicines only as told by your health care provider.  If you start taking any new medicines, tell your health care provider.  Keep all follow-up visits as told by your health care provider. This is important. ? As your condition improves, your dosage of thyroid hormone medicine may change. ? You will need to have blood tests regularly so that your health care provider can monitor your condition. Contact a health care provider if:  Your symptoms do not get better with treatment.  You are taking thyroid replacement medicine and you: ? Sweat a lot. ? Have tremors. ? Feel anxious. ? Lose weight rapidly. ? Cannot tolerate heat. ? Have emotional swings. ? Have diarrhea. ? Feel weak. Get help right away if you have:  Chest pain.  An irregular heartbeat.  A rapid heartbeat.  Difficulty breathing. Summary  Hypothyroidism is when the thyroid gland does not make enough of certain hormones (it is underactive).  When the thyroid is underactive, it produces too little  of the hormones thyroxine (T4) and triiodothyronine (T3).  The most common cause is Hashimoto's disease, a disease in which the body's disease-fighting system (immune system) attacks the thyroid gland. The condition can also be caused by viral infections, medicine, pregnancy, or past radiation treatment to the head or neck.  Symptoms may include weight gain, dry skin,  constipation, feeling as though you do not have energy, and not being able to tolerate cold.  This condition is treated with medicine to replace the thyroid hormones that your body does not make. This information is not intended to replace advice given to you by your health care provider. Make sure you discuss any questions you have with your health care provider. Document Released: 03/02/2005 Document Revised: 02/12/2017 Document Reviewed: 02/10/2017 Elsevier Patient Education  2020 [employer].

## 2019-02-02 NOTE — Progress Notes (Addendum)
Established Patient Office Visit  Subjective:  Patient ID: Zachary Duncan, male    DOB: [birth year]  Age: 68 y.o. MRN: [id number]  CC:  Chief Complaint  Patient presents with  . Annual Exam    HPI Zachary Duncan presents for a complete physical exam and follow-up for his hypothyroidism.  Patient is of his first pneumonia vaccine and agrees to take it.  He has had dental care this year.  Continues to work.  He travels over 10,000 steps each day at his work.  He lives alone.  He does not smoke or use illicit drugs.  He drinks alcohol on occasion.  History of hypothyroidism.  He has not tolerated tolerated levothyroxine because he says that it causes chest wall discomfort.  He denies palpitations with shortness of breath or nausea.  He feels as though he has no symptoms of hypothyroidism.  He asks about natural alternatives.  Urine flow has slowed throughout the years.  Recently diagnosed with a UTI and treated with Cipro.  Review of the medical record shows a positive UA but a negative culture.  Symptoms of ureteric urinary hesitancy were improved after the Cipro treatment.  Past Medical History:  Diagnosis Date  . Dermatophytosis of nail 05/10/2007  . Hyperlipidemia     Past Surgical History:  Procedure Laterality Date  . COLONOSCOPY W/ BIOPSIES AND POLYPECTOMY      Family History  Problem Relation Age of Onset  . Heart disease Mother   . Heart disease Father   . Colon cancer Neg Hx   . Thyroid disease Neg Hx     Social History   Socioeconomic History  . Marital status: Divorced    Spouse name: Not on file  . Number of children: 1  . Years of education: 62  . Highest education level: Not on file  Occupational History  . Occupation: Lexicographer  Social Needs  . Financial resource strain: Not on file  . Food insecurity    Worry: Not on file    Inability: Not on file  . Transportation needs    Medical: Not on file    Non-medical: Not on file  Tobacco Use  .  Smoking status: Never Smoker  . Smokeless tobacco: Never Used  Substance and Sexual Activity  . Alcohol use: Yes    Comment: 2-3 monthly  . Drug use: No  . Sexual activity: Not on file  Lifestyle  . Physical activity    Days per week: Not on file    Minutes per session: Not on file  . Stress: Not on file  Relationships  . Social Herbalist on phone: Not on file    Gets together: Not on file    Attends religious service: Not on file    Active member of club or organization: Not on file    Attends meetings of clubs or organizations: Not on file    Relationship status: Not on file  . Intimate partner violence    Fear of current or ex partner: Not on file    Emotionally abused: Not on file    Physically abused: Not on file    Forced sexual activity: Not on file  Other Topics Concern  . Not on file  Social History Narrative   Fun: Tennis when able    Denies religious beliefs effecting health care.     Outpatient Medications Prior to Visit  Medication Sig Dispense Refill  . acetaminophen (TYLENOL) 500 MG  tablet Take 1 tablet (500 mg total) by mouth every 6 (six) hours as needed. 30 tablet 0  . Carbamide Peroxide-Saline (EAR WAX CLEANSING) 6.5 % KIT Follow kit directions. 1 kit 2  . levothyroxine (SYNTHROID, LEVOTHROID) 50 MCG tablet Take one each morning on a fasting stomach one hour prior to eating. 90 tablet 0   No facility-administered medications prior to visit.     No Known Allergies  ROS Review of Systems  Constitutional: Negative for diaphoresis, fever and unexpected weight change.  Eyes: Negative for photophobia and visual disturbance.  Respiratory: Negative.   Cardiovascular: Negative.   Gastrointestinal: Negative.  Negative for anal bleeding, blood in stool and constipation.  Endocrine: Negative for polyphagia and polyuria.  Genitourinary: Negative for difficulty urinating, frequency and urgency.  Musculoskeletal: Negative for gait problem and joint  swelling.  Skin: Negative for pallor.  Allergic/Immunologic: Negative for immunocompromised state.  Neurological: Negative for light-headedness, numbness and headaches.  Hematological: Does not bruise/bleed easily.  Psychiatric/Behavioral: Negative.       Objective:    Physical Exam  Constitutional: He is oriented to person, place, and time. He appears well-developed and well-nourished. No distress.  HENT:  Head: Normocephalic and atraumatic.  Right Ear: External ear normal.  Left Ear: External ear normal.  Mouth/Throat: Oropharynx is clear and moist. No oropharyngeal exudate.  Eyes: Conjunctivae are normal. Right eye exhibits no discharge. Left eye exhibits no discharge. No scleral icterus.  Neck: Normal range of motion. Neck supple. No JVD present. No tracheal deviation present. No thyromegaly present.  Cardiovascular: Normal rate, normal heart sounds and intact distal pulses.  Pulmonary/Chest: Effort normal and breath sounds normal.  Abdominal: Soft. Bowel sounds are normal. He exhibits no distension. There is no abdominal tenderness. There is no rebound.  Genitourinary: Rectum:     Guaiac result negative.     No rectal mass, anal fissure, tenderness, external hemorrhoid, internal hemorrhoid or abnormal anal tone.  Prostate is enlarged. Prostate is not tender.  Musculoskeletal:        General: No edema.  Lymphadenopathy:    He has no cervical adenopathy.  Neurological: He is alert and oriented to person, place, and time. He has normal strength.  Skin: Skin is warm and dry. He is not diaphoretic.  Psychiatric: He has a normal mood and affect. His behavior is normal.    BP 122/70   Pulse (!) 50   Ht 5' 10.5" (1.791 m)   Wt 184 lb (83.5 kg)   SpO2 97%   BMI 26.03 kg/m  Wt Readings from Last 3 Encounters:  02/02/19 184 lb (83.5 kg)  01/23/19 184 lb 8 oz (83.7 kg)  08/17/17 191 lb 6 oz (86.8 kg)   BP Readings from Last 3 Encounters:  02/02/19 122/70  01/23/19 120/70   01/15/19 (!) 145/72   Guideline developer:  UpToDate (see UpToDate for funding source) Date Released: June 2014  There are no preventive care reminders to display for this patient.  There are no preventive care reminders to display for this patient.  Lab Results  Component Value Date   TSH 7.42 (H) 02/02/2019   Lab Results  Component Value Date   WBC 5.9 02/02/2019   HGB 14.3 02/02/2019   HCT 41.7 02/02/2019   MCV 92.1 02/02/2019   PLT 227.0 02/02/2019   Lab Results  Component Value Date   NA 140 02/02/2019   K 4.2 02/02/2019   CO2 29 02/02/2019   GLUCOSE 103 (H) 02/02/2019   BUN  13 02/02/2019   CREATININE 0.97 02/02/2019   BILITOT 1.6 (H) 02/02/2019   ALKPHOS 94 02/02/2019   AST 11 02/02/2019   ALT 11 02/02/2019   PROT 6.5 02/02/2019   ALBUMIN 4.5 02/02/2019   CALCIUM 9.1 02/02/2019   ANIONGAP 10 01/15/2019   GFR 76.91 02/02/2019   Lab Results  Component Value Date   CHOL 176 02/02/2019   Lab Results  Component Value Date   HDL 40.20 02/02/2019   Lab Results  Component Value Date   LDLCALC 115 (H) 02/02/2019   Lab Results  Component Value Date   TRIG 103.0 02/02/2019   Lab Results  Component Value Date   CHOLHDL 4 02/02/2019   No results found for: HGBA1C    Assessment & Plan:   Problem List Items Addressed This Visit      Endocrine   Hypothyroidism   Relevant Medications   liothyronine (CYTOMEL) 25 MCG tablet     Genitourinary   History of UTI     Other   Healthcare maintenance - Primary   Relevant Orders   Pneumococcal polysaccharide vaccine 23-valent greater than or equal to 2yo subcutaneous/IM (Completed)   Elevated PSA, less than 10 ng/ml      Meds ordered this encounter  Medications  . liothyronine (CYTOMEL) 25 MCG tablet    Sig: Take 1 tablet (25 mcg total) by mouth daily.    Dispense:  30 tablet    Refill:  2    Follow-up: Return in about 3 months (around 05/05/2019).   Patient was given information on health  maintenance and disease prevention.  He was also given information on hypothyroidism.  Will need to use alternative to levothyroxine for this patient.  11/19 addendum: Elevated PSA with recent UTI.  We will recheck in 3 months.

## 2019-08-29 ENCOUNTER — Telehealth: Payer: Self-pay | Admitting: Family Medicine

## 2019-08-29 NOTE — Telephone Encounter (Signed)
Patient is calling and wanted to see if he could get a copy of physical and labs from the past previous years. Patient wanted to see if he can pick them up. Also patient wanted to know if an ekg was apart of a physical. CB is [phone]

## 2019-08-30 NOTE — Telephone Encounter (Signed)
Last CPE and labs printed ready for pick up. Patient aware

## 2020-01-22 DIAGNOSIS — H524 Presbyopia: Secondary | ICD-10-CM | POA: Diagnosis not present

## 2020-01-22 DIAGNOSIS — H52223 Regular astigmatism, bilateral: Secondary | ICD-10-CM | POA: Diagnosis not present

## 2020-01-22 DIAGNOSIS — D3131 Benign neoplasm of right choroid: Secondary | ICD-10-CM | POA: Diagnosis not present

## 2020-01-22 DIAGNOSIS — H2513 Age-related nuclear cataract, bilateral: Secondary | ICD-10-CM | POA: Diagnosis not present

## 2020-10-01 DIAGNOSIS — D2362 Other benign neoplasm of skin of left upper limb, including shoulder: Secondary | ICD-10-CM | POA: Diagnosis not present

## 2020-10-01 DIAGNOSIS — D2262 Melanocytic nevi of left upper limb, including shoulder: Secondary | ICD-10-CM | POA: Diagnosis not present

## 2020-10-01 DIAGNOSIS — L814 Other melanin hyperpigmentation: Secondary | ICD-10-CM | POA: Diagnosis not present

## 2020-10-01 DIAGNOSIS — D1722 Benign lipomatous neoplasm of skin and subcutaneous tissue of left arm: Secondary | ICD-10-CM | POA: Diagnosis not present

## 2020-10-01 DIAGNOSIS — L821 Other seborrheic keratosis: Secondary | ICD-10-CM | POA: Diagnosis not present

## 2020-10-01 DIAGNOSIS — B351 Tinea unguium: Secondary | ICD-10-CM | POA: Diagnosis not present

## 2021-01-16 DIAGNOSIS — D171 Benign lipomatous neoplasm of skin and subcutaneous tissue of trunk: Secondary | ICD-10-CM | POA: Diagnosis not present

## 2021-01-16 DIAGNOSIS — D1722 Benign lipomatous neoplasm of skin and subcutaneous tissue of left arm: Secondary | ICD-10-CM | POA: Diagnosis not present

## 2021-01-16 DIAGNOSIS — L918 Other hypertrophic disorders of the skin: Secondary | ICD-10-CM | POA: Diagnosis not present

## 2021-01-16 DIAGNOSIS — B078 Other viral warts: Secondary | ICD-10-CM | POA: Diagnosis not present

## 2021-01-16 DIAGNOSIS — L821 Other seborrheic keratosis: Secondary | ICD-10-CM | POA: Diagnosis not present

## 2021-01-16 DIAGNOSIS — L603 Nail dystrophy: Secondary | ICD-10-CM | POA: Diagnosis not present

## 2021-01-16 DIAGNOSIS — L820 Inflamed seborrheic keratosis: Secondary | ICD-10-CM | POA: Diagnosis not present

## 2021-05-26 ENCOUNTER — Other Ambulatory Visit: Payer: Self-pay

## 2021-05-27 ENCOUNTER — Ambulatory Visit (INDEPENDENT_AMBULATORY_CARE_PROVIDER_SITE_OTHER): Payer: PPO | Admitting: Family Medicine

## 2021-05-27 ENCOUNTER — Encounter: Payer: Self-pay | Admitting: Family Medicine

## 2021-05-27 VITALS — BP 146/74 | HR 53 | Temp 97.1°F | Ht 69.0 in | Wt 186.8 lb

## 2021-05-27 DIAGNOSIS — H6123 Impacted cerumen, bilateral: Secondary | ICD-10-CM | POA: Diagnosis not present

## 2021-05-27 DIAGNOSIS — Z0000 Encounter for general adult medical examination without abnormal findings: Secondary | ICD-10-CM | POA: Diagnosis not present

## 2021-05-27 DIAGNOSIS — E039 Hypothyroidism, unspecified: Secondary | ICD-10-CM

## 2021-05-27 DIAGNOSIS — E782 Mixed hyperlipidemia: Secondary | ICD-10-CM | POA: Diagnosis not present

## 2021-05-27 DIAGNOSIS — Z1211 Encounter for screening for malignant neoplasm of colon: Secondary | ICD-10-CM | POA: Diagnosis not present

## 2021-05-27 DIAGNOSIS — R030 Elevated blood-pressure reading, without diagnosis of hypertension: Secondary | ICD-10-CM | POA: Insufficient documentation

## 2021-05-27 DIAGNOSIS — R972 Elevated prostate specific antigen [PSA]: Secondary | ICD-10-CM | POA: Diagnosis not present

## 2021-05-27 DIAGNOSIS — Z91199 Patient's noncompliance with other medical treatment and regimen due to unspecified reason: Secondary | ICD-10-CM | POA: Insufficient documentation

## 2021-05-27 DIAGNOSIS — Z23 Encounter for immunization: Secondary | ICD-10-CM | POA: Diagnosis not present

## 2021-05-27 LAB — COMPREHENSIVE METABOLIC PANEL
ALT: 13 U/L (ref 0–53)
AST: 14 U/L (ref 0–37)
Albumin: 4.8 g/dL (ref 3.5–5.2)
Alkaline Phosphatase: 109 U/L (ref 39–117)
BUN: 20 mg/dL (ref 6–23)
CO2: 28 mEq/L (ref 19–32)
Calcium: 9.5 mg/dL (ref 8.4–10.5)
Chloride: 103 mEq/L (ref 96–112)
Creatinine, Ser: 1.04 mg/dL (ref 0.40–1.50)
GFR: 72.72 mL/min (ref >60.00)
Glucose, Bld: 96 mg/dL (ref 70–99)
Potassium: 4.2 mEq/L (ref 3.5–5.1)
Sodium: 139 mEq/L (ref 135–145)
Total Bilirubin: 1.8 mg/dL — ABNORMAL HIGH (ref 0.2–1.2)
Total Protein: 6.6 g/dL (ref 6.0–8.3)

## 2021-05-27 LAB — CBC WITH DIFFERENTIAL/PLATELET
Basophils Absolute: 0 10*3/uL (ref 0.0–0.1)
Basophils Relative: 0.5 % (ref 0.0–3.0)
Eosinophils Absolute: 0.1 10*3/uL (ref 0.0–0.7)
Eosinophils Relative: 2.2 % (ref 0.0–5.0)
HCT: 43.1 % (ref 39.0–52.0)
Hemoglobin: 14.7 g/dL (ref 13.0–17.0)
Lymphocytes Relative: 25.6 % (ref 12.0–46.0)
Lymphs Abs: 1.6 10*3/uL (ref 0.7–4.0)
MCHC: 34.1 g/dL (ref 30.0–36.0)
MCV: 92.4 fl (ref 78.0–100.0)
Monocytes Absolute: 0.3 10*3/uL (ref 0.1–1.0)
Monocytes Relative: 5.5 % (ref 3.0–12.0)
Neutro Abs: 4.1 10*3/uL (ref 1.4–7.7)
Neutrophils Relative %: 66.2 % (ref 43.0–77.0)
Platelets: 204 10*3/uL (ref 150.0–400.0)
RBC: 4.67 Mil/uL (ref 4.22–5.81)
RDW: 13.1 % (ref 11.5–15.5)
WBC: 6.2 10*3/uL (ref 4.0–10.5)

## 2021-05-27 LAB — LIPID PANEL
Cholesterol: 175 mg/dL (ref 0–200)
HDL: 42.2 mg/dL (ref >39.00)
LDL Cholesterol: 111 mg/dL — ABNORMAL HIGH (ref 0–99)
NonHDL: 132.78
Total CHOL/HDL Ratio: 4
Triglycerides: 108 mg/dL (ref 0.0–149.0)
VLDL: 21.6 mg/dL (ref 0.0–40.0)

## 2021-05-27 LAB — URINALYSIS
Bilirubin Urine: NEGATIVE
Ketones, ur: NEGATIVE
Leukocytes,Ua: NEGATIVE
Nitrite: NEGATIVE
Specific Gravity, Urine: 1.025 (ref 1.000–1.030)
Total Protein, Urine: NEGATIVE
Urine Glucose: NEGATIVE
Urobilinogen, UA: 0.2 (ref 0.0–1.0)
pH: 5.5 (ref 5.0–8.0)

## 2021-05-27 LAB — PSA: PSA: 3.4 ng/mL (ref 0.10–4.00)

## 2021-05-27 LAB — HEMOGLOBIN A1C: Hgb A1c MFr Bld: 5.5 % (ref 4.6–6.5)

## 2021-05-27 LAB — TSH: TSH: 9.56 u[IU]/mL — ABNORMAL HIGH (ref 0.35–5.50)

## 2021-05-27 LAB — T4, FREE: Free T4: 0.76 ng/dL (ref 0.60–1.60)

## 2021-05-27 LAB — T3, FREE: T3, Free: 3.8 pg/mL (ref 2.3–4.2)

## 2021-05-27 NOTE — Progress Notes (Addendum)
Established Patient Office Visit  Subjective:  Patient ID: Zachary Duncan, male    DOB: [birth year]  Age: 71 y.o. MRN: [id number]  CC:  Chief Complaint  Patient presents with   Annual Exam    CPE, raised vein on right side of temple seems to come and go. Right ears seems clogged. Patient fasting.     HPI Zachary Duncan presents for a physical exam and follow-up of hypothyroidism, hyperlipidemia, elevated PSA with BPH symptoms, cerumen gnosis.  Lost to follow-up for 2 years.  He has discontinued his thyroid medication because he felt tingling in his chest wall.  He maintains that he has no symptoms of hypothyroidism.  He has been using eardrops for earwax and gentle irrigation.  He tells decreased force of stream frequency and nocturia x3.  Fluid restricting prior to bed  Past Medical History:  Diagnosis Date   Dermatophytosis of nail 05/10/2007   Hyperlipidemia     Past Surgical History:  Procedure Laterality Date   COLONOSCOPY W/ BIOPSIES AND POLYPECTOMY      Family History  Problem Relation Age of Onset   Heart disease Mother    Heart disease Father    Colon cancer Neg Hx    Thyroid disease Neg Hx     Social History   Socioeconomic History   Marital status: Divorced    Spouse name: Not on file   Number of children: 1   Years of education: 14   Highest education level: Not on file  Occupational History   Occupation: Oncologist  Tobacco Use   Smoking status: Never   Smokeless tobacco: Never  Vaping Use   Vaping Use: Never used  Substance and Sexual Activity   Alcohol use: Yes    Alcohol/week: 1.0 standard drink    Types: 1 Standard drinks or equivalent per week    Comment: rare   Drug use: No   Sexual activity: Not Currently  Other Topics Concern   Not on file  Social History Narrative   Fun: Tennis when able    Denies religious beliefs effecting health care.    Social Determinants of Health   Financial Resource Strain: Not on file  Food  Insecurity: Not on file  Transportation Needs: Not on file  Physical Activity: Not on file  Stress: Not on file  Social Connections: Not on file  Intimate Partner Violence: Not on file    Outpatient Medications Prior to Visit  Medication Sig Dispense Refill   Carbamide Peroxide-Saline (EAR WAX CLEANSING) 6.5 % KIT Follow kit directions. 1 kit 2   ibuprofen (ADVIL) 200 MG tablet Take 200 mg by mouth every 6 (six) hours as needed.     acetaminophen (TYLENOL) 500 MG tablet Take 1 tablet (500 mg total) by mouth every 6 (six) hours as needed. 30 tablet 0   levothyroxine (SYNTHROID, LEVOTHROID) 50 MCG tablet Take one each morning on a fasting stomach one hour prior to eating. (Patient not taking: Reported on 05/27/2021) 90 tablet 0   liothyronine (CYTOMEL) 25 MCG tablet Take 1 tablet (25 mcg total) by mouth daily. 30 tablet 2   No facility-administered medications prior to visit.    No Known Allergies  ROS Review of Systems  Constitutional:  Negative for chills, diaphoresis, fatigue, fever and unexpected weight change.  HENT: Negative.  Negative for hearing loss.   Eyes:  Negative for photophobia and visual disturbance.  Respiratory: Negative.    Cardiovascular: Negative.   Gastrointestinal: Negative.  Negative  for abdominal pain, anal bleeding, blood in stool and constipation.  Endocrine: Negative for polyphagia and polyuria.  Genitourinary:  Positive for difficulty urinating, frequency and urgency.  Musculoskeletal:  Negative for gait problem and joint swelling.  Neurological:  Negative for speech difficulty, weakness and light-headedness.  Hematological:  Does not bruise/bleed easily.     Depression screen [facility] 2/9 05/27/2021 07/15/2016  Decreased Interest 0 0  Down, Depressed, Hopeless 0 0  PHQ - 2 Score 0 0     Objective:    Physical Exam Vitals and nursing note reviewed.  Constitutional:      General: He is not in acute distress.    Appearance: Normal appearance. He is not  ill-appearing, toxic-appearing or diaphoretic.  HENT:     Head: Normocephalic and atraumatic.     Right Ear: There is impacted cerumen.     Left Ear: There is impacted cerumen.     Mouth/Throat:     Mouth: Mucous membranes are moist.     Pharynx: Oropharynx is clear. No oropharyngeal exudate or posterior oropharyngeal erythema.  Eyes:     General: No scleral icterus.       Right eye: No discharge.        Left eye: No discharge.     Extraocular Movements: Extraocular movements intact.     Conjunctiva/sclera: Conjunctivae normal.     Pupils: Pupils are equal, round, and reactive to light.  Cardiovascular:     Rate and Rhythm: Normal rate and regular rhythm.  Pulmonary:     Effort: Pulmonary effort is normal.     Breath sounds: Normal breath sounds.  Abdominal:     General: Bowel sounds are normal. There is no distension.     Palpations: There is no mass.     Tenderness: There is no abdominal tenderness. There is no guarding or rebound.     Hernia: No hernia is present.  Musculoskeletal:     Cervical back: No rigidity or tenderness.  Lymphadenopathy:     Cervical: No cervical adenopathy.  Skin:    General: Skin is warm and dry.  Neurological:     Mental Status: He is alert and oriented to person, place, and time.  Psychiatric:        Mood and Affect: Mood normal.        Behavior: Behavior normal.    BP (!) 146/74 (BP Location: Left Arm, Patient Position: Sitting, Cuff Size: Normal)   Pulse (!) 53   Temp (!) 97.1 F (36.2 C) (Temporal)   Ht 5\' 9"  (1.753 m)   Wt 186 lb 12.8 oz (84.7 kg)   SpO2 97%   BMI 27.59 kg/m  Wt Readings from Last 3 Encounters:  05/27/21 186 lb 12.8 oz (84.7 kg)  02/02/19 184 lb (83.5 kg)  01/23/19 184 lb 8 oz (83.7 kg)     There are no preventive care reminders to display for this patient.   There are no preventive care reminders to display for this patient.  Lab Results  Component Value Date   TSH 9.56 (H) 05/27/2021   Lab Results   Component Value Date   WBC 6.2 05/27/2021   HGB 14.7 05/27/2021   HCT 43.1 05/27/2021   MCV 92.4 05/27/2021   PLT 204.0 05/27/2021   Lab Results  Component Value Date   NA 139 05/27/2021   K 4.2 05/27/2021   CO2 28 05/27/2021   GLUCOSE 96 05/27/2021   BUN 20 05/27/2021   CREATININE 1.04 05/27/2021  BILITOT 1.8 (H) 05/27/2021   ALKPHOS 109 05/27/2021   AST 14 05/27/2021   ALT 13 05/27/2021   PROT 6.6 05/27/2021   ALBUMIN 4.8 05/27/2021   CALCIUM 9.5 05/27/2021   ANIONGAP 10 01/15/2019   GFR 72.72 05/27/2021   Lab Results  Component Value Date   CHOL 175 05/27/2021   Lab Results  Component Value Date   HDL 42.20 05/27/2021   Lab Results  Component Value Date   LDLCALC 111 (H) 05/27/2021   Lab Results  Component Value Date   TRIG 108.0 05/27/2021   Lab Results  Component Value Date   CHOLHDL 4 05/27/2021   Lab Results  Component Value Date   HGBA1C 5.5 05/27/2021      Assessment & Plan:   Problem List Items Addressed This Visit       Endocrine   Hypothyroidism   Relevant Orders   TSH (Completed)   T3, free (Completed)   T4, free (Completed)   Ambulatory referral to Endocrinology     Nervous and Auditory   Excessive cerumen in both ear canals     Other   Hyperlipidemia - Primary   Relevant Orders   Comprehensive metabolic panel (Completed)   Lipid panel (Completed)   Healthcare maintenance   Relevant Orders   Hemoglobin A1c (Completed)   Elevated PSA, less than 10 ng/ml   Relevant Orders   PSA (Completed)   Need for pneumococcal vaccine   Relevant Orders   Pneumococcal conjugate vaccine 20-valent (Prevnar 20) (Completed)   Elevated BP without diagnosis of hypertension   Relevant Orders   CBC with Differential/Platelet (Completed)   Comprehensive metabolic panel (Completed)   Urinalysis (Completed)    No orders of the defined types were placed in this encounter.   Follow-up: Return in about 1 month (around 06/27/2021), or Return  for BP Check and ear irrigation if needed..   Given information on health maintenance and disease prevention for those over 65.  Encouraged more regular follow-up.  Given information about earwax buildup and ear irrigation.  Information was given on how to take blood pressure and encouraged him to home.  Information was also given on hypothyroidism.  He is reluctant to treat it.  We discussed endocrinology referral if TSH remains elevated.  Discussed urology referral if PSA remains elevated.  Encouraged regular exercise for 30 minutes 5 days weekly. Mliss Sax, MD

## 2021-06-02 NOTE — Addendum Note (Signed)
Addended by: Jon Billings on: 06/02/2021 07:27 AM ? ? Modules accepted: Orders ? ?

## 2021-06-02 NOTE — Progress Notes (Addendum)
TSH has increased.  This would suggest that hypothyroidism is not controlled.  As discussed, lets have endocrinology see you for consultation. ? ? ? ? ? ?Mostly due to age, your risk score for vascular disease or having heart attack or stroke is elevated.  I am recommending that you take a cholesterol medicine.  Please let me know. ? ?The 10-year ASCVD risk score (Arnett DK, et al., 2019) is: 22.4% ?  Values used to calculate the score: ?    Age: 71 years ?    Sex: Male ?    Is Non-Hispanic African American: No ?    Diabetic: No ?    Tobacco smoker: No ?    Systolic Blood Pressure: 629 mmHg ?    Is BP treated: No ?    HDL Cholesterol: 42.2 mg/dL ?    Total Cholesterol: 175 mg/dL  ? ?06/04/21 addendum: Patient declines statin.  ?

## 2021-06-02 NOTE — Addendum Note (Signed)
Addended by: Lynda Rainwater on: 06/02/2021 04:28 PM ? ? Modules accepted: Orders ? ?

## 2021-06-17 ENCOUNTER — Ambulatory Visit: Payer: PPO | Admitting: Family Medicine

## 2021-07-11 DIAGNOSIS — L820 Inflamed seborrheic keratosis: Secondary | ICD-10-CM | POA: Diagnosis not present

## 2021-07-15 ENCOUNTER — Ambulatory Visit (INDEPENDENT_AMBULATORY_CARE_PROVIDER_SITE_OTHER): Payer: PPO | Admitting: Family Medicine

## 2021-07-15 ENCOUNTER — Encounter: Payer: Self-pay | Admitting: Family Medicine

## 2021-07-15 VITALS — BP 142/80 | HR 74 | Temp 97.6°F | Ht 69.0 in | Wt 192.6 lb

## 2021-07-15 DIAGNOSIS — E782 Mixed hyperlipidemia: Secondary | ICD-10-CM

## 2021-07-15 DIAGNOSIS — I10 Essential (primary) hypertension: Secondary | ICD-10-CM | POA: Diagnosis not present

## 2021-07-15 DIAGNOSIS — E039 Hypothyroidism, unspecified: Secondary | ICD-10-CM

## 2021-07-15 DIAGNOSIS — H6123 Impacted cerumen, bilateral: Secondary | ICD-10-CM

## 2021-07-15 MED ORDER — LOSARTAN POTASSIUM 50 MG PO TABS: 50.0000 mg | ORAL_TABLET | Freq: Every day | ORAL | 1 refills | Status: DC

## 2021-07-15 NOTE — Progress Notes (Signed)
? ?Established Patient Office Visit ? ?Subjective   ?Patient ID: Zachary Duncan, male    DOB: 02/25/1951  Age: 71 y.o. MRN: [id number] ? ?Chief Complaint  ?Patient presents with  ? Follow-up  ?  Follow up on BP, irrigate right ear.   ? ? ?HPI follow-up of elevated blood pressure, hypothyroidism and ceruminosis.  Has been using over-the-counter earwax removal kit without much success.  Denies headaches blurred vision or dizziness.  Has not been seen by endocrine endocrinology. ? ? ? ?Review of Systems  ?Constitutional: Negative.   ?HENT: Negative.    ?Eyes:  Negative for blurred vision, discharge and redness.  ?Respiratory: Negative.    ?Cardiovascular: Negative.   ?Gastrointestinal:  Negative for abdominal pain.  ?Genitourinary: Negative.   ?Musculoskeletal: Negative.  Negative for myalgias.  ?Skin:  Negative for rash.  ?Neurological:  Negative for dizziness, tingling, loss of consciousness, weakness and headaches.  ?Endo/Heme/Allergies:  Negative for polydipsia.  ? ?  ?Objective:  ?  ? ?BP (!) 142/80 (BP Location: Left Arm, Patient Position: Sitting, Cuff Size: Normal)   Pulse 74   Temp 97.6 ?F (36.4 ?C) (Temporal)   Ht $R'5\' 9"'Dd$  (1.753 m)   Wt 192 lb 9.6 oz (87.4 kg)   SpO2 95%   BMI 28.44 kg/m?  ?BP Readings from Last 3 Encounters:  ?07/15/21 (!) 142/80  ?05/27/21 (!) 146/74  ?02/02/19 122/70  ? ?  ? ?Physical Exam ?Constitutional:   ?   General: He is not in acute distress. ?   Appearance: Normal appearance. He is not ill-appearing, toxic-appearing or diaphoretic.  ?HENT:  ?   Head: Normocephalic and atraumatic.  ?   Right Ear: External ear normal.  ?   Left Ear: External ear normal.  ?   Ears:  ? ?   Mouth/Throat:  ?   Mouth: Mucous membranes are moist.  ?   Pharynx: Oropharynx is clear. No oropharyngeal exudate or posterior oropharyngeal erythema.  ?Eyes:  ?   General: No scleral icterus.    ?   Right eye: No discharge.     ?   Left eye: No discharge.  ?   Extraocular Movements: Extraocular movements  intact.  ?   Conjunctiva/sclera: Conjunctivae normal.  ?   Pupils: Pupils are equal, round, and reactive to light.  ?Cardiovascular:  ?   Rate and Rhythm: Normal rate and regular rhythm.  ?Pulmonary:  ?   Effort: Pulmonary effort is normal. No respiratory distress.  ?   Breath sounds: Normal breath sounds.  ?Abdominal:  ?   General: Bowel sounds are normal.  ?   Tenderness: There is no abdominal tenderness. There is no guarding.  ?Musculoskeletal:  ?   Cervical back: No rigidity or tenderness.  ?Skin: ?   General: Skin is warm and dry.  ?Neurological:  ?   Mental Status: He is alert and oriented to person, place, and time.  ?Psychiatric:     ?   Mood and Affect: Mood normal.     ?   Behavior: Behavior normal.  ? ?Subjective:  ? ? Zachary Duncan is a 71 y.o. male whom I am asked to see for evaluation of diminished hearing in both ears for the past 6 months. There is a prior history of cerumen impaction. The patient has been using ear drops to loosen wax immediately prior to this visit. The patient denies ear pain. ? ?The patient's history has been marked as reviewed and updated as appropriate. ? ?Review of  Systems ?Pertinent items are noted in HPI.  ?  ?Objective:  ? ? Auditory canal(s) of both ears are partially obstructed with cerumen.  ? ?Cerumen was removed using gentle irrigation. Tympanic membranes are intact following the procedure.  Auditory canals are normal.  ?  ?Assessment:  ? ? Cerumen Impaction without otitis externa.  ?  ?Plan:  ? ? 1. Care instructions given. ?2. Home treatment: none. ?3. Follow-up as needed.  ? ? ?No results found for any visits on 07/15/21. ? ? ? ?The 10-year ASCVD risk score (Arnett DK, et al., 2019) is: 24.8% ? ?  ?Assessment & Plan:  ? ?Problem List Items Addressed This Visit   ? ?  ? Endocrine  ? Hypothyroidism - Primary  ? Relevant Orders  ? Ambulatory referral to Endocrinology  ?  ? Nervous and Auditory  ? Excessive cerumen in both ear canals  ?  ? Other  ? Hyperlipidemia  ?  Relevant Medications  ? losartan (COZAAR) 50 MG tablet  ? ?Other Visit Diagnoses   ? ? Essential hypertension      ? Relevant Medications  ? losartan (COZAAR) 50 MG tablet  ? ?  ? ? ?Return in about 6 weeks (around 08/26/2021).  ?We will start losartan at 50 mg.  Information was given on managing hypertension.  Information was given on preventing high cholesterol.  While his lipid profile is mostly acceptable, he does have an increased risk of vascular disease this is due to his age blood pressure.   Continues reluctant for treatment for hypothyroidism because he has no symptoms.  Advised that people have died from hypothyroidism.  Will refer to endocrinology for a second opinion. ? ?Libby Maw, MD ? ?

## 2021-08-26 ENCOUNTER — Ambulatory Visit: Payer: PPO | Admitting: Family Medicine

## 2021-08-27 ENCOUNTER — Ambulatory Visit: Payer: PPO

## 2021-08-27 ENCOUNTER — Telehealth: Payer: Self-pay

## 2021-08-27 NOTE — Telephone Encounter (Signed)
Called x 3 no answer , no answer.  Patient may reschedule for the next available appointment.  L.Katara Griner,LPN

## 2021-09-04 NOTE — Telephone Encounter (Signed)
Left message for patient to call back and schedule Medicare Annual Wellness Visit (AWV). Please offer to do virtually or by telephone.  Left office number and my jabber #[phone]. ? ?Due for AWVI ? ?Please schedule at anytime with Nurse Health Advisor. ?  ?

## 2021-09-11 ENCOUNTER — Encounter: Payer: Self-pay | Admitting: Gastroenterology

## 2021-09-19 ENCOUNTER — Telehealth: Payer: Self-pay | Admitting: Family Medicine

## 2021-09-19 NOTE — Telephone Encounter (Signed)
Left message for patient to call back and schedule Medicare Annual Wellness Visit (AWV). Please offer to do virtually or by telephone.  Left office number and my jabber #[phone]. ? ?Due for AWVI ? ?Please schedule at anytime with Nurse Health Advisor. ?  ?

## 2021-09-23 NOTE — Telephone Encounter (Signed)
I spoke to patient. Patient said he's working full time.  He usually has Wednesdays off.  Patient scheduled appointment on Wednesday,  10/01/21.

## 2021-10-01 ENCOUNTER — Telehealth: Payer: Self-pay

## 2021-10-01 ENCOUNTER — Ambulatory Visit: Payer: PPO

## 2021-10-01 NOTE — Telephone Encounter (Signed)
Called x 4 no answer left voice mail message to return the call to reschedule.  L.Daejah Klebba,,LPN

## 2021-10-15 ENCOUNTER — Other Ambulatory Visit: Payer: Self-pay | Admitting: *Deleted

## 2021-10-15 NOTE — Patient Outreach (Signed)
  Care Coordination   10/15/2021 Name: Zachary Duncan MRN: [id number] DOB: 1950/05/04   Care Coordination Outreach Attempts:  Contact was made with the patient today to offer care coordination services as a benefit of their health plan. The patient requested a return call on a later date.   Follow Up Plan:  Additional outreach attempts will be made to offer the patient care coordination information and services.   Encounter Outcome:  Pt. Request to Call Back  Care Coordination Interventions Activated:  No   Care Coordination Interventions:  No, not indicated    Emelia Loron RN, BSN [place] [phone] [email or web address]

## 2021-12-01 NOTE — Telephone Encounter (Signed)
I spoke to patient and he said he's not interested in doing the AWV.

## 2022-01-15 ENCOUNTER — Telehealth: Payer: Self-pay

## 2022-01-15 NOTE — Telephone Encounter (Signed)
Call pt and LVM to return call and to schedule and appointment for AWV.

## 2022-01-16 ENCOUNTER — Telehealth: Payer: Self-pay

## 2022-01-16 DIAGNOSIS — Z1211 Encounter for screening for malignant neoplasm of colon: Secondary | ICD-10-CM

## 2022-01-16 NOTE — Telephone Encounter (Signed)
Patient over due for 5 year colonoscopy. Per patient he will call to schedule appointment referral placed in case needed.

## 2022-03-20 ENCOUNTER — Ambulatory Visit: Payer: PPO | Admitting: *Deleted

## 2022-03-20 VITALS — Ht 70.0 in | Wt 181.0 lb

## 2022-03-20 DIAGNOSIS — Z86010 Personal history of colonic polyps: Secondary | ICD-10-CM

## 2022-03-20 MED ORDER — NA SULFATE-K SULFATE-MG SULF 17.5-3.13-1.6 GM/177ML PO SOLN: 1.0000 | Freq: Once | ORAL | 0 refills | Status: AC

## 2022-03-20 NOTE — Progress Notes (Signed)

## 2022-03-23 ENCOUNTER — Ambulatory Visit: Payer: PPO

## 2022-03-25 ENCOUNTER — Telehealth: Payer: Self-pay

## 2022-03-25 NOTE — Patient Outreach (Signed)
  Care Coordination   03/25/2022 Name: SAVEON PLANT MRN: [id number] DOB: Sep 20, 1950   Care Coordination Outreach Attempts:  An unsuccessful telephone outreach was attempted today to offer the patient information about available care coordination services as a benefit of their health plan.   Follow Up Plan:  Additional outreach attempts will be made to offer the patient care coordination information and services.   Encounter Outcome:  No Answer   Care Coordination Interventions:  No, not indicated    Jone Baseman, RN, MSN [place] [facility] Care Management Coordinator Direct Line [phone]

## 2022-03-26 ENCOUNTER — Encounter: Payer: Self-pay | Admitting: Gastroenterology

## 2022-03-30 ENCOUNTER — Encounter: Payer: Self-pay | Admitting: Gastroenterology

## 2022-03-30 ENCOUNTER — Ambulatory Visit (AMBULATORY_SURGERY_CENTER): Payer: PPO | Admitting: Gastroenterology

## 2022-03-30 VITALS — BP 132/78 | HR 50 | Temp 98.1°F | Resp 19 | Ht 70.0 in | Wt 181.0 lb

## 2022-03-30 DIAGNOSIS — D123 Benign neoplasm of transverse colon: Secondary | ICD-10-CM | POA: Diagnosis not present

## 2022-03-30 DIAGNOSIS — K635 Polyp of colon: Secondary | ICD-10-CM | POA: Diagnosis not present

## 2022-03-30 DIAGNOSIS — D128 Benign neoplasm of rectum: Secondary | ICD-10-CM | POA: Diagnosis not present

## 2022-03-30 DIAGNOSIS — Z86010 Personal history of colonic polyps: Secondary | ICD-10-CM | POA: Diagnosis not present

## 2022-03-30 DIAGNOSIS — E039 Hypothyroidism, unspecified: Secondary | ICD-10-CM | POA: Diagnosis not present

## 2022-03-30 DIAGNOSIS — Z09 Encounter for follow-up examination after completed treatment for conditions other than malignant neoplasm: Secondary | ICD-10-CM

## 2022-03-30 MED ORDER — SODIUM CHLORIDE 0.9 % IV SOLN: 500.0000 mL | Freq: Once | INTRAVENOUS | Status: DC

## 2022-03-30 NOTE — Progress Notes (Signed)
Called to room to assist during endoscopic procedure.  Patient ID and intended procedure confirmed with present staff. Received instructions for my participation in the procedure from the performing physician.  

## 2022-03-30 NOTE — Progress Notes (Signed)
Pt's states no medical or surgical changes since previsit or office visit. 

## 2022-03-30 NOTE — Op Note (Signed)
[place] Patient Name: Zachary Duncan Procedure Date: 03/30/2022 10:49 AM MRN: [id number] Endoscopist: Mallie Mussel L. 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 The patient's tolerance of                            previous anesthesia was also reviewed. The risks                            and benefits of the procedure and the sedation                            options and risks were discussed with the patient.                            All questions were answered, and informed consent                            was obtained. Prior Anticoagulants: The patient has                            taken no anticoagulant or antiplatelet agents. ASA                            Grade Assessment: II - A patient with mild systemic  disease. After reviewing the risks and benefits,                            the patient was deemed in satisfactory condition to                            undergo the procedure.                           After obtaining informed consent, the colonoscope                            was passed under direct vision. Throughout the                            procedure, the patient's blood pressure, pulse, and                             oxygen saturations were monitored continuously. The                            Olympus CF-HQ190L (Serial# [id number]) Colonoscope was                            introduced through the anus and advanced to the the                            cecum, identified by appendiceal orifice and                            ileocecal valve. The colonoscopy was somewhat                            difficult due to a redundant colon and significant                            looping. Successful completion of the procedure was                            aided by changing the patient's position, using                            manual pressure and straightening and shortening                            the scope to obtain bowel loop reduction. The                            patient tolerated the procedure well. The quality                            of the bowel preparation was good. The ileocecal  valve, appendiceal orifice, and rectum were                            photographed. Scope In: 11:02:32 AM Scope Out: 11:22:41 AM Scope Withdrawal Time: 0 hours 12 minutes 26 seconds  Total Procedure Duration: 0 hours 20 minutes 9 seconds  Findings:                 The perianal and digital rectal examinations were                            normal.                           Two sessile polyps were found in the rectum and                            transverse colon. The polyps were diminutive in                            size. These polyps were removed with a cold snare.                            Resection and retrieval were complete.                           Repeat examination of right colon under NBI                            performed.                           The exam was otherwise without abnormality on                            direct and retroflexion views. Complications:            No immediate complications. Estimated Blood Loss:     Estimated blood loss  was minimal. Impression:               - Two diminutive polyps in the rectum and in the                            transverse colon, removed with a cold snare.                            Resected and retrieved.                           - The examination was otherwise normal on direct                            and retroflexion views. Recommendation:           - Patient has a contact number available for  emergencies. The signs and symptoms of potential                            delayed complications were discussed with the                            patient. Return to normal activities tomorrow.                            Written discharge instructions were provided to the                            patient.                           - Resume previous diet.                           - Continue present medications.                           - Await pathology results.                           - Repeat colonoscopy is recommended for                            surveillance. The colonoscopy date will be                            determined after pathology results from today's                            exam become available for review. Quill Grinder L. Loletha Carrow, MD 03/30/2022 11:28:04 AM This report has been signed electronically.

## 2022-03-30 NOTE — Progress Notes (Signed)
History and Physical:  This patient presents for endoscopic testing for: Encounter Diagnosis  Name Primary?   Personal history of colonic polyps Yes   Surveillance colonoscopy June 2018 - Diminutive TA and HP 15m TVA 2009 Patient denies chronic abdominal pain, rectal bleeding, constipation or diarrhea.   Patient is otherwise without complaints or active issues today.   Past Medical History: Past Medical History:  Diagnosis Date   Dermatophytosis of nail 05/10/2007   Hyperlipidemia    Hypothyroidism      Past Surgical History: Past Surgical History:  Procedure Laterality Date   COLONOSCOPY W/ BIOPSIES AND POLYPECTOMY      Allergies: No Known Allergies  Outpatient Meds: Current Outpatient Medications  Medication Sig Dispense Refill   Carbamide Peroxide-Saline (EAR WAX CLEANSING) 6.5 % KIT Follow kit directions. (Patient not taking: Reported on 07/15/2021) 1 kit 2   ibuprofen (ADVIL) 200 MG tablet Take 200 mg by mouth every 6 (six) hours as needed.     levothyroxine (SYNTHROID, LEVOTHROID) 50 MCG tablet Take one each morning on a fasting stomach one hour prior to eating. (Patient not taking: Reported on 03/30/2022) 90 tablet 0   losartan (COZAAR) 50 MG tablet Take 1 tablet (50 mg total) by mouth daily. (Patient not taking: Reported on 03/20/2022) 90 tablet 1   Current Facility-Administered Medications  Medication Dose Route Frequency Provider Last Rate Last Admin   0.9 %  sodium chloride infusion  500 mL Intravenous Once DDoran Stabler MD          ___________________________________________________________________ Objective   Exam:  BP (!) 152/77   Pulse (!) 56   Temp 98.1 F (36.7 C)   Ht '5\' 10"'$  (1.778 m)   Wt 181 lb (82.1 kg)   SpO2 99%   BMI 25.97 kg/m   CV: regular , S1/S2 Resp: clear to auscultation bilaterally, normal RR and effort noted GI: soft, no tenderness, with active bowel sounds.   Assessment: Encounter Diagnosis  Name Primary?    Personal history of colonic polyps Yes     Plan: Colonoscopy  The benefits and risks of the planned procedure were described in detail with the patient or (when appropriate) their health care proxy.  Risks were outlined as including, but not limited to, bleeding, infection, perforation, adverse medication reaction leading to cardiac or pulmonary decompensation, pancreatitis (if ERCP).  The limitation of incomplete mucosal visualization was also discussed.  No guarantees or warranties were given.    The patient is appropriate for an endoscopic procedure in the ambulatory setting.   - HWilfrid Lund MD

## 2022-03-30 NOTE — Progress Notes (Signed)
Pt awake, alert and oriented. VSS. Airway intact. SBAR complete to RN. All questions answered.  

## 2022-03-30 NOTE — Patient Instructions (Signed)
Discharge instructions given. Handout on polyps. Resume previous medications. YOU HAD AN ENDOSCOPIC PROCEDURE TODAY AT THE [facility]:   Refer to the procedure report that was given to you for any specific questions about what was found during the examination.  If the procedure report does not answer your questions, please call your gastroenterologist to clarify.  If you requested that your care partner not be given the details of your procedure findings, then the procedure report has been included in a sealed envelope for you to review at your convenience later.  YOU SHOULD EXPECT: Some feelings of bloating in the abdomen. Passage of more gas than usual.  Walking can help get rid of the air that was put into your GI tract during the procedure and reduce the bloating. If you had a lower endoscopy (such as a colonoscopy or flexible sigmoidoscopy) you may notice spotting of blood in your stool or on the toilet paper. If you underwent a bowel prep for your procedure, you may not have a normal bowel movement for a few days.  Please Note:  You might notice some irritation and congestion in your nose or some drainage.  This is from the oxygen used during your procedure.  There is no need for concern and it should clear up in a day or so.  SYMPTOMS TO REPORT IMMEDIATELY:  Following lower endoscopy (colonoscopy or flexible sigmoidoscopy):  Excessive amounts of blood in the stool  Significant tenderness or worsening of abdominal pains  Swelling of the abdomen that is new, acute  Fever of 100F or higher   For urgent or emergent issues, a gastroenterologist can be reached at any hour by calling [phone]. Do not use MyChart messaging for urgent concerns.    DIET:  We do recommend a small meal at first, but then you may proceed to your regular diet.  Drink plenty of fluids but you should avoid alcoholic beverages for 24 hours.  ACTIVITY:  You should plan to take it easy for the rest  of today and you should NOT DRIVE or use heavy machinery until tomorrow (because of the sedation medicines used during the test).    FOLLOW UP: Our staff will call the number listed on your records the next business day following your procedure.  We will call around 7:15- 8:00 am to check on you and address any questions or concerns that you may have regarding the information given to you following your procedure. If we do not reach you, we will leave a message.     If any biopsies were taken you will be contacted by phone or by letter within the next 1-3 weeks.  Please call us at [phone] if you have not heard about the biopsies in 3 weeks.    SIGNATURES/CONFIDENTIALITY: You and/or your care partner have signed paperwork which will be entered into your electronic medical record.  These signatures attest to the fact that that the information above on your After Visit Summary has been reviewed and is understood.  Full responsibility of the confidentiality of this discharge information lies with you and/or your care-partner. 

## 2022-03-31 ENCOUNTER — Telehealth: Payer: Self-pay

## 2022-03-31 NOTE — Telephone Encounter (Signed)
  Follow up Call-     03/30/2022    9:51 AM  Call back number  Post procedure Call Back phone  # [phone]  Permission to leave phone message Yes     Patient questions:  Do you have a fever, pain , or abdominal swelling? No. Pain Score  0 *  Have you tolerated food without any problems? Yes.    Have you been able to return to your normal activities? Yes.    Do you have any questions about your discharge instructions: Diet   No. Medications  No. Follow up visit  No.  Do you have questions or concerns about your Care? No.  Actions: * If pain score is 4 or above: No action needed, pain <4.

## 2022-04-02 ENCOUNTER — Encounter: Payer: Self-pay | Admitting: Gastroenterology

## 2022-04-08 ENCOUNTER — Telehealth: Payer: Self-pay | Admitting: Family Medicine

## 2022-04-08 ENCOUNTER — Telehealth: Payer: Self-pay

## 2022-04-08 NOTE — Patient Instructions (Signed)
Visit Information  Thank you for taking time to visit with me today. Please don't hesitate to contact me if I can be of assistance to you.   Following are the goals we discussed today:   Goals Addressed             This Visit's Progress    COMPLETED: Care Coordination Activities-No follow up required       Care Coordination Interventions: Advised patient to Annual Wellness exam. Discussed [facility] services and support. Assessed SDOH. Advised to discuss with primary care physician if services needed in the future.         If you are experiencing a Mental Health or Behavioral Health Crisis or need someone to talk to, please call the Suicide and Crisis Lifeline: 988   Patient verbalizes understanding of instructions and care plan provided today and agrees to view in MyChart. Active MyChart status and patient understanding of how to access instructions and care plan via MyChart confirmed with patient.     No further follow up required: decline  Megyn Leng J Jesus Poplin, RN, MSN [facility] Care Management Care Management Coordinator Direct Line [phone]     

## 2022-04-08 NOTE — Patient Outreach (Signed)
  Care Coordination   Initial Visit Note   04/08/2022 Name: Zachary Duncan MRN: [id number] DOB: [birth year]  Zachary Duncan is a 71 y.o. year old male who sees Libby Maw, MD for primary care. I spoke with  Zachary Duncan by phone today.  What matters to the patients health and wellness today?  none    Goals Addressed             This Visit's Progress    COMPLETED: Care Coordination Activities-No follow up required       Care Coordination Interventions: Advised patient to Annual Wellness exam. Discussed [facility] services and support. Assessed SDOH. Advised to discuss with primary care physician if services needed in the future.         SDOH assessments and interventions completed:  Yes  SDOH Interventions Today    Flowsheet Row Most Recent Value  SDOH Interventions   Housing Interventions Intervention Not Indicated  Transportation Interventions Intervention Not Indicated        Care Coordination Interventions:  Yes, provided   Follow up plan: No further intervention required.   Encounter Outcome:  Pt. Visit Completed   Jone Baseman, RN, MSN [facility] Care Management Coordinator Direct Line [phone]

## 2022-04-08 NOTE — Telephone Encounter (Signed)
Spoke with patient to schedule Medicare Annual Wellness Visit (AWV) either virtually or in office. Patient declined not interested   DUE 10/14/2020 AWVI PER [facility] please schedule with Nurse Health Adviser   45 min for awv-i  in office appointments 30 min for awv-s & awv-i phone/virtual appointments

## 2022-04-28 DIAGNOSIS — L821 Other seborrheic keratosis: Secondary | ICD-10-CM | POA: Diagnosis not present

## 2023-01-07 DIAGNOSIS — R001 Bradycardia, unspecified: Secondary | ICD-10-CM | POA: Diagnosis not present

## 2023-01-07 DIAGNOSIS — H538 Other visual disturbances: Secondary | ICD-10-CM | POA: Diagnosis not present

## 2023-01-08 ENCOUNTER — Ambulatory Visit (INDEPENDENT_AMBULATORY_CARE_PROVIDER_SITE_OTHER): Payer: PPO | Admitting: Nurse Practitioner

## 2023-01-08 ENCOUNTER — Encounter: Payer: Self-pay | Admitting: Nurse Practitioner

## 2023-01-08 VITALS — BP 164/80 | HR 57 | Temp 97.8°F | Ht 70.0 in | Wt 195.0 lb

## 2023-01-08 DIAGNOSIS — R001 Bradycardia, unspecified: Secondary | ICD-10-CM | POA: Diagnosis not present

## 2023-01-08 DIAGNOSIS — R079 Chest pain, unspecified: Secondary | ICD-10-CM | POA: Diagnosis not present

## 2023-01-08 DIAGNOSIS — H538 Other visual disturbances: Secondary | ICD-10-CM | POA: Diagnosis not present

## 2023-01-08 DIAGNOSIS — I10 Essential (primary) hypertension: Secondary | ICD-10-CM | POA: Diagnosis not present

## 2023-01-08 MED ORDER — LOSARTAN POTASSIUM 50 MG PO TABS: 50.0000 mg | ORAL_TABLET | Freq: Every day | ORAL | 1 refills | Status: DC

## 2023-01-08 NOTE — Progress Notes (Signed)
Acute Office Visit  Subjective:     Patient ID: Zachary Duncan, male    DOB: 01-Nov-1950, 72 y.o.   MRN: [id number]  Chief Complaint  Patient presents with   Hospitalization Follow-up    Follow up on BP at [facility]    HPI Patient is in today for ER follow-up for blurry vision and elevated blood pressure yesterday.   Discussed the use of AI scribe software for clinical note transcription with the patient, who gave verbal consent to proceed.  History of Present Illness   The patient, a 72 year old with a history of hypertension and borderline hypothyroidism, presents with a recent episode of blurred vision in the left eye. The episode lasted for about two to three minutes and resolved spontaneously. The patient was playing pickleball for about three hours and doing yard work for about an hour and a half prior to the episode. He reported adequate hydration during these activities. The patient sought immediate medical attention at an urgent care center and was subsequently directed to the emergency room. In the ER, the patient's blood pressure was initially elevated at 190, which decreased to 155 and then increased to 170 before discharge. The patient's heart rate was noted to be low. The ER physician did not suspect a stroke or mini-stroke based on the evaluation. The patient denies any other symptoms, including pain, weakness in the arms or legs, slurred speech, or confusion.      ROS See pertinent positives and negatives per HPI.     Objective:    BP (!) 164/80 (BP Location: Left Arm, Cuff Size: Large)   Pulse (!) 57   Temp 97.8 F (36.6 C)   Ht 5\' 10"  (1.778 m)   Wt 195 lb (88.5 kg)   SpO2 97%   BMI 27.98 kg/m     Physical Exam Vitals and nursing note reviewed.  Constitutional:      Appearance: Normal appearance.  HENT:     Head: Normocephalic.  Eyes:     Conjunctiva/sclera: Conjunctivae normal.  Cardiovascular:     Rate and Rhythm: Normal rate and  regular rhythm.     Pulses: Normal pulses.     Heart sounds: Normal heart sounds.  Pulmonary:     Effort: Pulmonary effort is normal.     Breath sounds: Normal breath sounds.  Musculoskeletal:     Cervical back: Normal range of motion.  Skin:    General: Skin is warm.  Neurological:     General: No focal deficit present.     Mental Status: He is alert and oriented to person, place, and time.     Cranial Nerves: No cranial nerve deficit.     Motor: No weakness.     Gait: Gait normal.  Psychiatric:        Mood and Affect: Mood normal.        Behavior: Behavior normal.        Thought Content: Thought content normal.        Judgment: Judgment normal.      Assessment & Plan:   Problem List Items Addressed This Visit       Cardiovascular and Mediastinum   Essential hypertension    Chronic, not controlled. He presented with elevated blood pressure readings at the ER and during the current visit, having been off Losartan. We will resume Losartan 50mg  daily, instruct him to check blood pressure at home, and monitor for any side effects. CMP, CBC reviewed from ER  visit.          Relevant Medications   losartan (COZAAR) 50 MG tablet     Other   Blurry vision, left eye - Primary    Following a brief episode of blurred vision in the left eye after physical exertion, without pain, weakness, slurred speech, or confusion, and an unremarkable ER evaluation including EKG and blood work, we discussed the potential stroke risk and the need for further evaluation. We will order an MRI of the brain to rule out cerebrovascular causes and continue Plavix and aspirin as a precautionary measure until MRI results are available.      Relevant Orders   MR Brain W Wo Contrast    Meds ordered this encounter  Medications   losartan (COZAAR) 50 MG tablet    Sig: Take 1 tablet (50 mg total) by mouth daily.    Dispense:  30 tablet    Refill:  1    Return in about 4 weeks (around 02/05/2023) for  HTN.  Gerre Scull, NP

## 2023-01-08 NOTE — Assessment & Plan Note (Signed)
Following a brief episode of blurred vision in the left eye after physical exertion, without pain, weakness, slurred speech, or confusion, and an unremarkable ER evaluation including EKG and blood work, we discussed the potential stroke risk and the need for further evaluation. We will order an MRI of the brain to rule out cerebrovascular causes and continue Plavix and aspirin as a precautionary measure until MRI results are available.

## 2023-01-08 NOTE — Assessment & Plan Note (Signed)
Chronic, not controlled. He presented with elevated blood pressure readings at the ER and during the current visit, having been off Losartan. We will resume Losartan 50mg  daily, instruct him to check blood pressure at home, and monitor for any side effects. CMP, CBC reviewed from ER visit.

## 2023-01-08 NOTE — Patient Instructions (Signed)
It was great to see you!  Start losartan 1 tablet daily   I have ordered a MRI of your head, they will call to schedule.   Let's follow-up with Dr. Doreene Burke as scheduled   Take care,  Rodman Pickle, NP

## 2023-01-11 ENCOUNTER — Ambulatory Visit (HOSPITAL_COMMUNITY)
Admission: RE | Admit: 2023-01-11 | Discharge: 2023-01-11 | Disposition: A | Discharge status: Home / Self Care | Payer: PPO | Source: Ambulatory Visit | Attending: Nurse Practitioner | Admitting: Nurse Practitioner

## 2023-01-11 ENCOUNTER — Telehealth: Payer: Self-pay | Admitting: Family Medicine

## 2023-01-11 DIAGNOSIS — H547 Unspecified visual loss: Secondary | ICD-10-CM | POA: Diagnosis not present

## 2023-01-11 DIAGNOSIS — H538 Other visual disturbances: Secondary | ICD-10-CM | POA: Insufficient documentation

## 2023-01-11 MED ORDER — GADOBUTROL 1 MMOL/ML IV SOLN
9.0000 mL | Freq: Once | INTRAVENOUS | Status: AC | PRN
  Administered 2023-01-11: 9 mL via INTRAVENOUS

## 2023-01-11 NOTE — Telephone Encounter (Signed)
Please call the patient a call . Pt said he suppose to get a MRI done today but do not know the location or time

## 2023-01-14 ENCOUNTER — Telehealth: Payer: Self-pay | Admitting: Family Medicine

## 2023-01-14 NOTE — Telephone Encounter (Signed)
I relayed message to pt, he understood.

## 2023-01-14 NOTE — Telephone Encounter (Signed)
Pt would like to know if he can come in for a nurse visit to check his bp. I let him know, we don't do this, this needs to be an OV. He wanted me to ask, if he could do this?

## 2023-01-17 DIAGNOSIS — I10 Essential (primary) hypertension: Secondary | ICD-10-CM | POA: Diagnosis not present

## 2023-01-18 NOTE — Telephone Encounter (Signed)
Pt is wanting a cb concerning his bp, he feels it should be dropping faster since he was put on a new script. I moved his cpe up to 01/21/23

## 2023-01-19 ENCOUNTER — Encounter: Payer: PPO | Admitting: Family Medicine

## 2023-01-21 ENCOUNTER — Ambulatory Visit (INDEPENDENT_AMBULATORY_CARE_PROVIDER_SITE_OTHER): Payer: PPO | Admitting: Family Medicine

## 2023-01-21 ENCOUNTER — Encounter: Payer: Self-pay | Admitting: Family Medicine

## 2023-01-21 VITALS — BP 138/72 | HR 56 | Temp 97.7°F | Wt 194.8 lb

## 2023-01-21 DIAGNOSIS — I10 Essential (primary) hypertension: Secondary | ICD-10-CM

## 2023-01-21 DIAGNOSIS — R918 Other nonspecific abnormal finding of lung field: Secondary | ICD-10-CM

## 2023-01-21 DIAGNOSIS — E782 Mixed hyperlipidemia: Secondary | ICD-10-CM | POA: Diagnosis not present

## 2023-01-21 DIAGNOSIS — R972 Elevated prostate specific antigen [PSA]: Secondary | ICD-10-CM

## 2023-01-21 DIAGNOSIS — E039 Hypothyroidism, unspecified: Secondary | ICD-10-CM | POA: Diagnosis not present

## 2023-01-21 DIAGNOSIS — R931 Abnormal findings on diagnostic imaging of heart and coronary circulation: Secondary | ICD-10-CM

## 2023-01-21 LAB — COMPREHENSIVE METABOLIC PANEL
ALT: 21 U/L (ref 0–53)
AST: 17 U/L (ref 0–37)
Albumin: 4.8 g/dL (ref 3.5–5.2)
Alkaline Phosphatase: 107 U/L (ref 39–117)
BUN: 15 mg/dL (ref 6–23)
CO2: 29 meq/L (ref 19–32)
Calcium: 9.6 mg/dL (ref 8.4–10.5)
Chloride: 102 meq/L (ref 96–112)
Creatinine, Ser: 1.03 mg/dL (ref 0.40–1.50)
GFR: 72.72 mL/min (ref >60.00)
Glucose, Bld: 95 mg/dL (ref 70–99)
Potassium: 4 meq/L (ref 3.5–5.1)
Sodium: 139 meq/L (ref 135–145)
Total Bilirubin: 2.1 mg/dL — ABNORMAL HIGH (ref 0.2–1.2)
Total Protein: 6.8 g/dL (ref 6.0–8.3)

## 2023-01-21 LAB — LIPID PANEL
Cholesterol: 193 mg/dL (ref 0–200)
HDL: 41.4 mg/dL (ref >39.00)
LDL Cholesterol: 123 mg/dL — ABNORMAL HIGH (ref 0–99)
NonHDL: 151.17
Total CHOL/HDL Ratio: 5
Triglycerides: 140 mg/dL (ref 0.0–149.0)
VLDL: 28 mg/dL (ref 0.0–40.0)

## 2023-01-21 LAB — PSA: PSA: 5.49 ng/mL — ABNORMAL HIGH (ref 0.10–4.00)

## 2023-01-21 LAB — CBC
HCT: 45.7 % (ref 39.0–52.0)
Hemoglobin: 15.5 g/dL (ref 13.0–17.0)
MCHC: 33.8 g/dL (ref 30.0–36.0)
MCV: 93.6 fL (ref 78.0–100.0)
Platelets: 216 10*3/uL (ref 150.0–400.0)
RBC: 4.89 Mil/uL (ref 4.22–5.81)
RDW: 13 % (ref 11.5–15.5)
WBC: 6.3 10*3/uL (ref 4.0–10.5)

## 2023-01-21 LAB — TSH: TSH: 8.53 u[IU]/mL — ABNORMAL HIGH (ref 0.35–5.50)

## 2023-01-21 NOTE — Patient Instructions (Addendum)
Tips to Measure your Blood Pressure Correctly  To determine whether you have hypertension, a medical professional will take a blood pressure reading. How you prepare for the test, the position of your arm, and other factors can change a blood pressure reading by 10% or more. That could be enough to hide high blood pressure, start you on a drug you don't really need, or lead your doctor to incorrectly adjust your medications.  National and international guidelines offer specific instructions for measuring blood pressure. If a doctor, nurse, or medical assistant isn't doing it right, don't hesitate to ask him or her to get with the guidelines.  Here's what you can do to ensure a correct reading:  Don't drink a caffeinated beverage or smoke during the 30 minutes before the test.  Sit quietly for five minutes before the test begins.  During the measurement, sit in a chair with your feet on the floor and your arm supported so your elbow is at about heart level.  The inflatable part of the cuff should completely cover at least 80% of your upper arm, and the cuff should be placed on bare skin, not over a shirt.  Don't talk during the measurement.  Have your blood pressure measured twice, with a brief break in between. If the readings are different by 5 points or more, have it done a third time.  There are times to break these rules. If you sometimes feel lightheaded when getting out of bed in the morning or when you stand after sitting, you should have your blood pressure checked while seated and then while standing to see if it falls from one position to the next.  Because blood pressure varies throughout the day, your doctor will rarely diagnose hypertension on the basis of a single reading. Instead, he or she will want to confirm the measurements on at least two occasions, usually within a few weeks of one another. The exception to this rule is if you have a blood pressure reading of 180/110 mm Hg or  higher. A result this high usually calls for prompt treatment.  It's a good idea to have your blood pressure measured in both arms at least once, since the reading in one arm (usually the right) may be higher than that in the left. A 2014 study in The American Journal of Medicine of nearly 3,400 people found average arm- to-arm differences in systolic blood pressure of about 5 points. The higher number should be used to make treatment decisions.

## 2023-01-21 NOTE — Progress Notes (Addendum)
Established Patient Office Visit   Subjective:  Patient ID: Zachary Duncan, male    DOB: 07/07/1950  Age: 72 y.o. MRN: [id number]  Chief Complaint  Patient presents with   Annual Exam    HPI Encounter Diagnoses  Name Primary?   Essential hypertension Yes   Hypothyroidism, unspecified type    Elevated PSA, less than 10 ng/ml    Mixed hyperlipidemia    Elevated coronary artery calcium score    Pulmonary nodules    For follow-up of above.  He has been lost to follow-up for over a year now.  Status post episode of blurred vision in his left eye on the 24th of last month.  Seen in the emergency room and a follow-up MRI was negative for stroke.  He has an appointment with ophthalmology next month he tells me.  Has been referred to endocrinology for hypothyroidism but never made the appointment.  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  He does not smoke.   Review of Systems  Constitutional: Negative.   HENT: Negative.    Eyes:  Negative for blurred vision, discharge and redness.  Respiratory: Negative.    Cardiovascular: Negative.   Gastrointestinal:  Negative for abdominal pain.  Genitourinary: Negative.   Musculoskeletal: Negative.  Negative for myalgias.  Skin:  Negative for rash.  Neurological:  Negative for tingling, loss of consciousness and weakness.  Endo/Heme/Allergies:  Negative for polydipsia.     Current Outpatient Medications:    losartan (COZAAR) 50 MG tablet, Take 1 tablet (50 mg total) by mouth daily., Disp: 30 tablet, Rfl: 1   Objective:     BP 138/72   Pulse (!) 56   Temp 97.7 F (36.5 C) (Temporal)   Wt 194 lb 12.8 oz (88.4 kg)   SpO2 98%   BMI 27.95 kg/m    Physical Exam Constitutional:      General: He is not in acute distress.    Appearance: Normal appearance. He is not ill-appearing, toxic-appearing or diaphoretic.  HENT:     Head: Normocephalic and atraumatic.     Right Ear: External ear normal.     Left Ear: External ear  normal.     Mouth/Throat:     Mouth: Mucous membranes are moist.     Pharynx: Oropharynx is clear. No oropharyngeal exudate or posterior oropharyngeal erythema.  Eyes:     General: No scleral icterus.       Right eye: No discharge.        Left eye: No discharge.     Extraocular Movements: Extraocular movements intact.     Conjunctiva/sclera: Conjunctivae normal.     Pupils: Pupils are equal, round, and reactive to light.  Cardiovascular:     Rate and Rhythm: Normal rate and regular rhythm.  Pulmonary:     Effort: Pulmonary effort is normal. No respiratory distress.     Breath sounds: Normal breath sounds. No wheezing, rhonchi or rales.  Abdominal:     General: Bowel sounds are normal.  Musculoskeletal:     Cervical back: No rigidity or tenderness.  Skin:    General: Skin is warm and dry.  Neurological:     Mental Status: He is alert and oriented to person, place, and time.  Psychiatric:        Mood and Affect: Mood normal.        Behavior: Behavior normal.      Results for orders placed or performed in visit on 01/21/23  CBC  Result Value Ref Range   WBC 6.3 4.0 - 10.5 K/uL   RBC 4.89 4.22 - 5.81 Mil/uL   Platelets 216.0 150.0 - 400.0 K/uL   Hemoglobin 15.5 13.0 - 17.0 g/dL   HCT 16.1 09.6 - 04.5 %   MCV 93.6 78.0 - 100.0 fl   MCHC 33.8 30.0 - 36.0 g/dL   RDW 40.9 81.1 - 91.4 %  Comprehensive metabolic panel  Result Value Ref Range   Sodium 139 135 - 145 mEq/L   Potassium 4.0 3.5 - 5.1 mEq/L   Chloride 102 96 - 112 mEq/L   CO2 29 19 - 32 mEq/L   Glucose, Bld 95 70 - 99 mg/dL   BUN 15 6 - 23 mg/dL   Creatinine, Ser 7.82 0.40 - 1.50 mg/dL   Total Bilirubin 2.1 (H) 0.2 - 1.2 mg/dL   Alkaline Phosphatase 107 39 - 117 U/L   AST 17 0 - 37 U/L   ALT 21 0 - 53 U/L   Total Protein 6.8 6.0 - 8.3 g/dL   Albumin 4.8 3.5 - 5.2 g/dL   GFR 95.62 >13.08 mL/min   Calcium 9.6 8.4 - 10.5 mg/dL  Lipid panel  Result Value Ref Range   Cholesterol 193 0 - 200 mg/dL    Triglycerides 657.8 0.0 - 149.0 mg/dL   HDL 46.96 >29.52 mg/dL   VLDL 84.1 0.0 - 32.4 mg/dL   LDL Cholesterol 401 (H) 0 - 99 mg/dL   Total CHOL/HDL Ratio 5    NonHDL 151.17   PSA  Result Value Ref Range   PSA 5.49 (H) 0.10 - 4.00 ng/mL  TSH  Result Value Ref Range   TSH 8.53 (H) 0.35 - 5.50 uIU/mL  Anti-TPO Ab (RDL)  Result Value Ref Range   Anti-TPO Ab (RDL) 37.8 (H) <9.0 IU/mL      The 10-year ASCVD risk score (Arnett DK, et al., 2019) is: 34.4%    Assessment & Plan:   Essential hypertension -     CBC -     Comprehensive metabolic panel -     Urinalysis, Routine w reflex microscopic -     CT CARDIAC SCORING (SELF PAY ONLY); Future  Hypothyroidism, unspecified type -     TSH -     Anti-TPO Ab (RDL) -     Ambulatory referral to Endocrinology  Elevated PSA, less than 10 ng/ml -     PSA -     Ambulatory referral to Urology  Mixed hyperlipidemia -     Lipid panel -     CT CARDIAC SCORING (SELF PAY ONLY); Future -     Ambulatory referral to Cardiology  Elevated coronary artery calcium score -     Ambulatory referral to Cardiology  Pulmonary nodules -     CT CHEST WO CONTRAST; Future    Return in about 3 months (around 04/23/2023) for chronic disease follow-up, annual physical.  Rechecking TSH with TPO antibodies.  Rereferral to endocrinology.  Agrees to go for coronary artery calcium scoring.  Rechecking elevated PSA.  Recommended statin for his elevated ASCVD risk score.  Mliss Sax, MD  12/3 addendum: Calcium score came back elevated at 253.  Recommended a statin and/or referral to cardiology for second opinion.  He is reluctant to take a statin.

## 2023-01-22 ENCOUNTER — Encounter: Payer: PPO | Admitting: Family Medicine

## 2023-01-26 ENCOUNTER — Encounter: Payer: PPO | Admitting: Family Medicine

## 2023-01-28 ENCOUNTER — Ambulatory Visit (HOSPITAL_BASED_OUTPATIENT_CLINIC_OR_DEPARTMENT_OTHER)
Admission: RE | Admit: 2023-01-28 | Discharge: 2023-01-28 | Disposition: A | Discharge status: Home / Self Care | Payer: PPO | Source: Ambulatory Visit | Attending: Family Medicine | Admitting: Family Medicine

## 2023-01-28 DIAGNOSIS — E782 Mixed hyperlipidemia: Secondary | ICD-10-CM | POA: Insufficient documentation

## 2023-01-28 DIAGNOSIS — I10 Essential (primary) hypertension: Secondary | ICD-10-CM | POA: Insufficient documentation

## 2023-01-28 LAB — ANTI-TPO AB (RDL): Anti-TPO Ab (RDL): 37.8 [IU]/mL — ABNORMAL HIGH (ref <9.0)

## 2023-02-09 ENCOUNTER — Telehealth: Payer: Self-pay | Admitting: Family Medicine

## 2023-02-09 NOTE — Telephone Encounter (Signed)
Pt would like a call back concerning getting a print off of him labs. He would like to know his calcium score.  He is under the impression he should have a referral to a urologist. Please advise pt at [phone]

## 2023-02-14 NOTE — Progress Notes (Signed)
Coronary artery calcium score is significantly elevated at 252.  I am recommending that you take a cholesterol medicine to lower your risk of the development of heart disease.  If you would like a second opinion with a cardiologist I would be happy to refer you for that.  Please let me know either way.

## 2023-02-15 ENCOUNTER — Telehealth: Payer: Self-pay | Admitting: Family Medicine

## 2023-02-15 NOTE — Telephone Encounter (Signed)
Pt would like to TOC to Dr Veto Kemps. He feels like Dr Veto Kemps would be a better fit.  When both have replied I will call the pt to schedule.

## 2023-02-15 NOTE — Telephone Encounter (Signed)
Pt is asking about a referral to Urology.

## 2023-02-16 ENCOUNTER — Telehealth: Payer: Self-pay | Admitting: Family Medicine

## 2023-02-16 DIAGNOSIS — R931 Abnormal findings on diagnostic imaging of heart and coronary circulation: Secondary | ICD-10-CM | POA: Insufficient documentation

## 2023-02-16 NOTE — Addendum Note (Signed)
Addended by: [name] A on: 02/16/2023 11:45 AM   Modules accepted: Orders

## 2023-02-16 NOTE — Telephone Encounter (Signed)
Pt has scheduled a TOC appt and requested a call from Dr Veto Kemps about his lab results and his BR results. I have told him I did not know if that can happen until he is established with Dr Veto Kemps.  [place] [phone]

## 2023-02-17 ENCOUNTER — Ambulatory Visit: Payer: PPO | Admitting: Cardiology

## 2023-02-18 ENCOUNTER — Ambulatory Visit: Payer: PPO | Admitting: Urology

## 2023-02-18 ENCOUNTER — Encounter: Payer: Self-pay | Admitting: Urology

## 2023-02-18 VITALS — BP 158/85 | HR 52 | Ht 70.5 in | Wt 193.0 lb

## 2023-02-18 DIAGNOSIS — R972 Elevated prostate specific antigen [PSA]: Secondary | ICD-10-CM | POA: Diagnosis not present

## 2023-02-18 LAB — URINALYSIS, ROUTINE W REFLEX MICROSCOPIC
Bilirubin, UA: NEGATIVE
Glucose, UA: NEGATIVE
Ketones, UA: NEGATIVE
Leukocytes,UA: NEGATIVE
Nitrite, UA: NEGATIVE
Protein,UA: NEGATIVE
Specific Gravity, UA: 1.02 (ref 1.005–1.030)
Urobilinogen, Ur: 0.2 mg/dL (ref 0.2–1.0)
pH, UA: 6 (ref 5.0–7.5)

## 2023-02-18 LAB — MICROSCOPIC EXAMINATION

## 2023-02-18 NOTE — Progress Notes (Addendum)
Assessment: 1. Elevated PSA     Plan: I personally reviewed the patient's chart including provider notes, and lab results. Today I had a long discussion with the patient regarding PSA and the rationale and controversies of prostate cancer early detection.  I discussed the pros and cons of further evaluation including TRUS and prostate Bx.  Potential adverse events and complications as well as standard instructions were given.  Patient expressed his understanding of these issues. Iso PSA sent today - will call with results  Chief Complaint:  Chief Complaint  Patient presents with   Elevated PSA    History of Present Illness:  Zachary Duncan is a 72 y.o. male who is seen in consultation from Mliss Sax, MD for evaluation of elevated PSA.  PSA results: 5/18 3.35 6/19 3.65 11/20 6.07 3/23 3.40 11/24 5.49  No prior prostate biopsy.  No family history of prostate cancer. He reports a history of UTI approximately 4 years ago.  No recent UTI symptoms. He has baseline lower urinary tract symptoms including intermittent stream, decreased stream, and nocturia x 2.  No dysuria or gross hematuria. IPSS = 14 today.   Past Medical History:  Past Medical History:  Diagnosis Date   Dermatophytosis of nail 05/10/2007   Hyperlipidemia    Hypothyroidism     Past Surgical History:  Past Surgical History:  Procedure Laterality Date   COLONOSCOPY W/ BIOPSIES AND POLYPECTOMY      Allergies:  No Known Allergies  Family History:  Family History  Problem Relation Age of Onset   Heart disease Mother    Colon polyps Father    Heart disease Father    Colon cancer Neg Hx    Thyroid disease Neg Hx    Crohn's disease Neg Hx    Esophageal cancer Neg Hx    Rectal cancer Neg Hx    Stomach cancer Neg Hx    Ulcerative colitis Neg Hx     Social History:  Social History   Tobacco Use   Smoking status: Never   Smokeless tobacco: Never  Vaping Use   Vaping status:  Never Used  Substance Use Topics   Alcohol use: Yes    Alcohol/week: 1.0 standard drink of alcohol    Types: 1 Standard drinks or equivalent per week    Comment: rare   Drug use: No    Review of symptoms:  Constitutional:  Negative for unexplained weight loss, night sweats, fever, chills ENT:  Negative for nose bleeds, sinus pain, painful swallowing CV:  Negative for chest pain, shortness of breath, exercise intolerance, palpitations, loss of consciousness Resp:  Negative for cough, wheezing, shortness of breath GI:  Negative for nausea, vomiting, diarrhea, bloody stools GU:  Positives noted in HPI; otherwise negative for gross hematuria, dysuria, urinary incontinence Neuro:  Negative for seizures, poor balance, limb weakness, slurred speech Psych:  Negative for lack of energy, depression, anxiety Endocrine:  Negative for polydipsia, polyuria, symptoms of hypoglycemia (dizziness, hunger, sweating) Hematologic:  Negative for anemia, purpura, petechia, prolonged or excessive bleeding, use of anticoagulants  Allergic:  Negative for difficulty breathing or choking as a result of exposure to anything; no shellfish allergy; no allergic response (rash/itch) to materials, foods  Physical exam: BP (!) 158/85   Pulse (!) 52   Ht 5' 10.5" (1.791 m)   Wt 193 lb (87.5 kg)   BMI 27.30 kg/m  GENERAL APPEARANCE:  Well appearing, well developed, well nourished, NAD HEENT: Atraumatic, Normocephalic, oropharynx clear. NECK:  Supple without lymphadenopathy or thyromegaly. LUNGS: Clear to auscultation bilaterally. HEART: Regular Rate and Rhythm without murmurs, gallops, or rubs. ABDOMEN: Soft, non-tender, No Masses. EXTREMITIES: Moves all extremities well.  Without clubbing, cyanosis, or edema. NEUROLOGIC:  Alert and oriented x 3, normal gait, CN II-XII grossly intact.  MENTAL STATUS:  Appropriate. BACK:  Non-tender to palpation.  No CVAT SKIN:  Warm, dry and intact.   GU: Penis:   uncircumcised Meatus: Normal Scrotum: normal, no masses Testis: normal without masses bilateral Prostate: 40 g, NT, no nodules Rectum: Normal tone,  no masses or tenderness   Results: U/A: 0-2 RBC

## 2023-02-24 NOTE — Telephone Encounter (Signed)
Spoke to patient, answered his questions as much as I can.  Spoke to provider and he will review all his labs and answer his questions at his TOC appt.  If something is available sooner than 03/21/22 can you call and schedule him.   Thanks. Marland Kitchendmc

## 2023-02-25 ENCOUNTER — Encounter: Payer: Self-pay | Admitting: Urology

## 2023-02-28 DIAGNOSIS — R918 Other nonspecific abnormal finding of lung field: Secondary | ICD-10-CM | POA: Insufficient documentation

## 2023-02-28 NOTE — Addendum Note (Signed)
Addended by: Andrez Grime on: 02/28/2023 08:37 PM   Modules accepted: Orders

## 2023-03-01 ENCOUNTER — Telehealth: Payer: Self-pay | Admitting: Urology

## 2023-03-01 NOTE — Telephone Encounter (Signed)
Pt called back regarding message that Dr Pete Glatter sent on 02/25/2023. Pt said to "sch him for whatever Dr Pete Glatter thinks he should do. Whether that is the Biopsy or the MRI".  If Dr would like him to sch a Biopsy over the MRI, I can call pt and do that.   Just let me know.   Thank you

## 2023-03-03 ENCOUNTER — Other Ambulatory Visit: Payer: Self-pay

## 2023-03-03 ENCOUNTER — Telehealth: Payer: Self-pay

## 2023-03-03 DIAGNOSIS — R972 Elevated prostate specific antigen [PSA]: Secondary | ICD-10-CM

## 2023-03-03 NOTE — Telephone Encounter (Signed)
Left msg for patient to call back to office to schedule biopsy.

## 2023-03-04 ENCOUNTER — Ambulatory Visit: Payer: PPO | Admitting: Family Medicine

## 2023-03-05 ENCOUNTER — Other Ambulatory Visit: Payer: Self-pay | Admitting: Nurse Practitioner

## 2023-03-05 ENCOUNTER — Other Ambulatory Visit: Payer: Self-pay | Admitting: Family Medicine

## 2023-03-05 DIAGNOSIS — I10 Essential (primary) hypertension: Secondary | ICD-10-CM

## 2023-03-05 NOTE — Telephone Encounter (Signed)
Pt is wanting to know if he should be put on a statin before he sees his cardiologist in February. His optometrist thinks this should be added. I have put him on the waiting list and let him know Dr. Veto Kemps will need to see him first before this can be done. He is getting Dr Dibgy to send over the lab result.

## 2023-03-05 NOTE — Telephone Encounter (Signed)
Requesting: LOSARTAN 50MG  TABLETS  Last Visit: 01/08/2023 Next Visit: 03/22/2023 Last Refill: 01/08/2023  Please Advise

## 2023-03-05 NOTE — Telephone Encounter (Signed)
Patient notified that Rx Refill sent to pharmacy.

## 2023-03-05 NOTE — Telephone Encounter (Signed)
Lft VM to RTN call. Dm/cma  

## 2023-03-08 NOTE — Telephone Encounter (Signed)
Lft VM to RTN call. Dm/cma  

## 2023-03-08 NOTE — Telephone Encounter (Signed)
Spoke with pt, he stated that after taking with friends and family he wants to have the MRI first prior to the biopsy. Let him know that the person who gets the authorizations for this is out this week due to the holiday and would work on it next week.   Please place order.

## 2023-03-09 ENCOUNTER — Other Ambulatory Visit: Payer: Self-pay | Admitting: Urology

## 2023-03-09 DIAGNOSIS — R972 Elevated prostate specific antigen [PSA]: Secondary | ICD-10-CM

## 2023-03-11 ENCOUNTER — Telehealth: Payer: Self-pay

## 2023-03-11 NOTE — Telephone Encounter (Signed)
Patient notified that he needs to wait to discuss this with Dr. Veto Kemps on 03/22/2023 per Dr. Doreene Burke.

## 2023-03-11 NOTE — Telephone Encounter (Signed)
Copied from CRM [id number]. Topic: Clinical - Medication Question >> Mar 11, 2023 10:39 AM Florestine Avers wrote: Reason for CRM: Optometrist recommended that he needs to start a statin medication, has some questions about that as well. Wants to start that medication asap.

## 2023-03-22 ENCOUNTER — Ambulatory Visit (INDEPENDENT_AMBULATORY_CARE_PROVIDER_SITE_OTHER): Payer: PPO | Admitting: Family Medicine

## 2023-03-22 ENCOUNTER — Encounter: Payer: Self-pay | Admitting: Family Medicine

## 2023-03-22 VITALS — BP 146/77 | HR 60 | Temp 97.6°F | Ht 70.5 in | Wt 191.8 lb

## 2023-03-22 DIAGNOSIS — R931 Abnormal findings on diagnostic imaging of heart and coronary circulation: Secondary | ICD-10-CM | POA: Diagnosis not present

## 2023-03-22 DIAGNOSIS — E782 Mixed hyperlipidemia: Secondary | ICD-10-CM | POA: Diagnosis not present

## 2023-03-22 DIAGNOSIS — E038 Other specified hypothyroidism: Secondary | ICD-10-CM | POA: Diagnosis not present

## 2023-03-22 DIAGNOSIS — I10 Essential (primary) hypertension: Secondary | ICD-10-CM | POA: Diagnosis not present

## 2023-03-22 DIAGNOSIS — R972 Elevated prostate specific antigen [PSA]: Secondary | ICD-10-CM

## 2023-03-22 MED ORDER — LOSARTAN POTASSIUM 100 MG PO TABS: 100.0000 mg | ORAL_TABLET | Freq: Every day | ORAL | 3 refills | Status: DC

## 2023-03-22 MED ORDER — ATORVASTATIN CALCIUM 20 MG PO TABS: 20.0000 mg | ORAL_TABLET | Freq: Every day | ORAL | 3 refills | Status: DC

## 2023-03-22 NOTE — Assessment & Plan Note (Signed)
 Scheduled to see cardiology. In the meantime, based on significant family history of premature heart disease, I strongly advise starting a statin. Discussed potential side effects. I recommend he take a CoQ-10 supplement along with this. Goal LDL should be < 70.

## 2023-03-22 NOTE — Progress Notes (Signed)
 [facility] [address] [place] [address] Dept: [phone] Dept Fax: [phone]  Transfer of Care Office Visit  Subjective:    Patient ID: Zachary Duncan, male    DOB: 02/20/1951, 73 y.o..   MRN: [id number]  Chief Complaint  Patient presents with   Establish Care    [facility]- establish care.   Wants to discuss labs.     History of Present Illness:  Patient is in today to establish care. Zachary Duncan was born in [place]. He attended [employer] for 1 year, studying business. 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 He currently works for [employer]. He is divorced. He has a son (41) and a granddaughter. He denies use of tobacco. He only drinks alcohol occasionally.  [name] has a history of hypertension. He is managed on losartan  50 mg daily. He has been followign his blood pressures at home. His 7-day average is 155/77.  [name] has a history of dyslipidemia. He had a coronary calcium  scan in Nov. He is scheduled to see cardiology for an assessment.  [name] has a long-term history of a mildly elevated TSH. He is currently scheduled to see an endocrinologist related to this. He denies any symptoms of hypothyroidism.   [name] has had an elevated PSA. He has been seen by urology. He is scheduled for an MRI of the prostate tomorrow.  Past Medical History: Patient Active Problem List   Diagnosis Date Noted   Pulmonary nodules 02/28/2023   Elevated coronary artery calcium  score 02/16/2023   Essential hypertension 01/08/2023   Blurry vision, left eye 01/08/2023   Elevated PSA 02/02/2019   Subclinical hypothyroidism 02/02/2019   Excessive cerumen in both ear canals 01/23/2019   History of UTI 01/23/2019   Hyperlipidemia    Finger joint swelling 10/16/2010   Dermatophytosis of nail 05/10/2007   Past Surgical History:  Procedure Laterality Date    COLONOSCOPY W/ BIOPSIES AND POLYPECTOMY     Family History  Problem Relation Age of Onset   Heart disease Mother 100   Colon polyps Father    Heart disease Father 36   Heart disease Brother    Heart disease Brother 15   Diabetes Maternal Grandfather    Colon cancer Neg Hx    Thyroid  disease Neg Hx    Crohn's disease Neg Hx    Esophageal cancer Neg Hx    Rectal cancer Neg Hx    Stomach cancer Neg Hx    Ulcerative colitis Neg Hx    Outpatient Medications Prior to Visit  Medication Sig Dispense Refill   losartan  (COZAAR ) 50 MG tablet TAKE 1 TABLET(50 MG) BY MOUTH DAILY 30 tablet 1   No facility-administered medications prior to visit.   No Known Allergies    Objective:   Today's Vitals   03/22/23 1328 03/22/23 1332  BP: (!) 150/72 (!) 146/77  Pulse: 60   Temp: 97.6 F (36.4 C)   TempSrc: Temporal   Weight: 191 lb 12.8 oz (87 kg)   Height: 5' 10.5 (1.791 m)    Body mass index is 27.13 kg/m.   General: Well developed, well nourished. No acute distress. Psych: Alert and oriented. Normal mood and affect.  Health Maintenance Due  Topic Date Due   Medicare Annual Wellness (AWV)  Never done   DTaP/Tdap/Td (1 - Tdap) Never done   Lab Results    Latest Ref Rng & Units 01/21/2023  9:40 AM 05/27/2021    9:10 AM 02/02/2019    8:39 AM  CBC  WBC 4.0 - 10.5 K/uL 6.3  6.2  5.9   Hemoglobin 13.0 - 17.0 g/dL 84.4  85.2  85.6   Hematocrit 39.0 - 52.0 % 45.7  43.1  41.7   Platelets 150.0 - 400.0 K/uL 216.0  204.0  227.0        Latest Ref Rng & Units 01/21/2023    9:40 AM 05/27/2021    9:10 AM 02/02/2019    8:39 AM  CMP  Glucose 70 - 99 mg/dL 95  96  896   BUN 6 - 23 mg/dL 15  20  13    Creatinine 0.40 - 1.50 mg/dL 8.96  8.95  9.02   Sodium 135 - 145 mEq/L 139  139  140   Potassium 3.5 - 5.1 mEq/L 4.0  4.2  4.2   Chloride 96 - 112 mEq/L 102  103  104   CO2 19 - 32 mEq/L 29  28  29    Calcium  8.4 - 10.5 mg/dL 9.6  9.5  9.1   Total Protein 6.0 - 8.3 g/dL 6.8  6.6  6.5    Total Bilirubin 0.2 - 1.2 mg/dL 2.1  1.8  1.6   Alkaline Phos 39 - 117 U/L 107  109  94   AST 0 - 37 U/L 17  14  11    ALT 0 - 53 U/L 21  13  11     Last lipids Lab Results  Component Value Date   CHOL 193 01/21/2023   HDL 41.40 01/21/2023   LDLCALC 123 (H) 01/21/2023   LDLDIRECT 130.0 07/15/2016   TRIG 140.0 01/21/2023   CHOLHDL 5 01/21/2023   Last thyroid  functions Lab Results  Component Value Date   TSH 8.53 (H) 01/21/2023   Component Ref Range & Units (hover) 2 mo ago  Anti-TPO Ab (RDL) 37.8 High    Lab Results  Component Value Date   PSA 5.49 (H) 01/21/2023   PSA 3.40 05/27/2021   PSA 6.07 (H) 02/02/2019   IsoPSA (02/18/2023) 11.5    Imaging: CT Cardiac Scoring (01/28/2023) IMPRESSION: Coronary calcium  score of 252. This was 52 percentile for age-, race-,  and sex-matched controls.  IMPRESSION: 1. Multiple subcentimeter noncalcified and calcified lung nodules, as described above. No follow-up needed if patient is low-risk (and has no known or suspected primary neoplasm). Non-contrast chest CT can be considered in 12 months if patient is high-risk. This recommendation follows the consensus statement: Guidelines for Management of Incidental Pulmonary Nodules Detected on CT Images: From the Fleischner Society 2017; Radiology 2017; 284:228-243. 2. Findings likely consistent with small hepatic cysts versus hemangiomas. Correlation with nonemergent hepatic ultrasound is recommended.  Assessment & Plan:   Problem List Items Addressed This Visit       Cardiovascular and Mediastinum   Elevated coronary artery calcium  score   Scheduled to see cardiology. In the meantime, based on significant family history of premature heart disease, I strongly advise starting a statin. Discussed potential side effects. I recommend he take a CoQ-10 supplement along with this. Goal LDL should be < 70.      Relevant Medications   losartan  (COZAAR ) 100 MG tablet   atorvastatin  (LIPITOR) 20  MG tablet   Essential hypertension - Primary   Blood pressure remains elevated. I recommend we increase his losartan  to 100 mg daily. He should send me a log of his BPs in 2 weeks.  Relevant Medications   losartan  (COZAAR ) 100 MG tablet   atorvastatin  (LIPITOR) 20 MG tablet     Endocrine   Subclinical hypothyroidism   Longstanding mildly elevated TSH with normal T4. TPO antibodies positive, indicative of Hashimoto's thyroiditis. At this point, it is not clear that he would benefit from thyroid  hormone replacement vs. monitoring. He has an upcoming appointment to discuss this with an endocrinologist.        Other   Elevated PSA   Scheduled for MRI to evaluate elevated PSA. Discussed that if the scan is normal, he may or may not need biopsy based on the recommendation of his urologist.      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 I do not feel it would be beneficial to do further testing. I reassured him of this being a common, benign condition.      Hyperlipidemia   As above. Recommend starting a statin.      Relevant Medications   losartan  (COZAAR ) 100 MG tablet   atorvastatin  (LIPITOR) 20 MG tablet    Return in about 3 months (around 06/20/2023) for Reassessment.   Garnette CHRISTELLA Simpler, MD

## 2023-03-22 NOTE — Assessment & Plan Note (Signed)
 Scheduled for MRI to evaluate elevated PSA. Discussed that if the scan is normal, he may or may not need biopsy based on the recommendation of his urologist.

## 2023-03-22 NOTE — Patient Instructions (Signed)
 Send blood pressure log to Dr. Veto Kemps in 2 weeks.

## 2023-03-22 NOTE — Assessment & Plan Note (Signed)
 As above. Recommend starting a statin.

## 2023-03-22 NOTE — Assessment & Plan Note (Signed)
 Blood pressure remains elevated. I recommend we increase his losartan to 100 mg daily. He should send me a log of his BPs in 2 weeks.

## 2023-03-22 NOTE — Assessment & Plan Note (Signed)
 Longstanding mildly elevated TSH with normal T4. TPO antibodies positive, indicative of Hashimoto's thyroiditis. At this point, it is not clear that he would benefit from thyroid  hormone replacement vs. monitoring. He has an upcoming appointment to discuss this with an endocrinologist.

## 2023-03-22 NOTE — Assessment & Plan Note (Signed)
 Patient has a long-standing mildly elevated bilirubin with other normal liver enzymes. 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 I do not feel it would be beneficial to do further testing. I reassured him of this being a common, benign condition.

## 2023-03-23 ENCOUNTER — Ambulatory Visit (HOSPITAL_COMMUNITY)
Admission: RE | Admit: 2023-03-23 | Discharge: 2023-03-23 | Disposition: A | Discharge status: Home / Self Care | Payer: PPO | Source: Ambulatory Visit | Attending: Urology | Admitting: Urology

## 2023-03-23 DIAGNOSIS — N4289 Other specified disorders of prostate: Secondary | ICD-10-CM | POA: Diagnosis not present

## 2023-03-23 DIAGNOSIS — N400 Enlarged prostate without lower urinary tract symptoms: Secondary | ICD-10-CM | POA: Diagnosis not present

## 2023-03-23 DIAGNOSIS — R972 Elevated prostate specific antigen [PSA]: Secondary | ICD-10-CM | POA: Insufficient documentation

## 2023-03-23 MED ORDER — GADOBUTROL 1 MMOL/ML IV SOLN
8.0000 mL | Freq: Once | INTRAVENOUS | Status: AC | PRN
  Administered 2023-03-23: 8 mL via INTRAVENOUS

## 2023-03-25 ENCOUNTER — Encounter: Payer: Self-pay | Admitting: Urology

## 2023-03-26 ENCOUNTER — Other Ambulatory Visit: Payer: Self-pay | Admitting: Urology

## 2023-03-26 DIAGNOSIS — R972 Elevated prostate specific antigen [PSA]: Secondary | ICD-10-CM

## 2023-04-08 ENCOUNTER — Telehealth: Payer: Self-pay | Admitting: Family Medicine

## 2023-04-08 ENCOUNTER — Encounter: Payer: Self-pay | Admitting: Family Medicine

## 2023-04-08 NOTE — Telephone Encounter (Signed)
Called patient and he states that the prescription bottle says Losartan Potassium 100 mg.   He just wanted to make sure that was the correct medication he is supposed to be on. Dm/cma

## 2023-04-08 NOTE — Telephone Encounter (Signed)
Copied from CRM [id number]. Topic: Clinical - Medication Question >> Apr 08, 2023  8:09 AM Leavy Cella D wrote: Reason for CRM: Patient called stating that he has a prescrption for losartan potassium  100 MG tablet and wanted to know if that was correct. Patient stated he's never been on that medication before

## 2023-04-08 NOTE — Telephone Encounter (Signed)
The medication is the same thing.  No further questions. Dm/cma

## 2023-04-08 NOTE — Telephone Encounter (Signed)
 This encounter was created in error - please disregard.

## 2023-04-12 DIAGNOSIS — N411 Chronic prostatitis: Secondary | ICD-10-CM | POA: Diagnosis not present

## 2023-04-12 DIAGNOSIS — C61 Malignant neoplasm of prostate: Secondary | ICD-10-CM | POA: Diagnosis not present

## 2023-04-12 DIAGNOSIS — R972 Elevated prostate specific antigen [PSA]: Secondary | ICD-10-CM | POA: Diagnosis not present

## 2023-04-14 ENCOUNTER — Encounter: Payer: Self-pay | Admitting: Urology

## 2023-04-15 ENCOUNTER — Telehealth: Payer: Self-pay | Admitting: Urology

## 2023-04-15 NOTE — Telephone Encounter (Signed)
Prostate biopsy results reviewed. PSA: 5.49 ng/ml PI-RADS 5 lesion on MRI. TRUS volume:  30 ml  PSA density:  0.18 Biopsy results:             Gleason score: 3+ 4 = 7            # positive cores: 0/6 on right    4/9 on left  (3 from ROI, 1 mid)            Location of cancer: mid gland, ROI on left  Complications after biopsy: none  MSCKK nomogram: 63% OC 36% ECE 3% SVI 3% LN  Partin table: 59% OC 34% ECE 6%   SVI 1% LN  NCCN risk group:  favorable intermediate risk  Results were discussed with the patient by phone today. Options for management of favorable intermediate risk prostate cancer discussed including active surveillance, radical prostatectomy, and radiation therapy. Given the findings of favorable intermediate risk prostate cancer, I recommended further evaluation with genomic testing to assist with management decisions. Will order a decipher test. Will arrange for follow-up in 3 weeks to review the results of the decipher test and to discuss management options further.

## 2023-04-15 NOTE — Telephone Encounter (Signed)
Pt called and would like a letter with the information about his Biopsy results  that you told him earlier today by phone. He stated he was with a customer and would like to confirm all that was said. Thank You.

## 2023-04-21 NOTE — Progress Notes (Signed)
Zachary Ponder, MD Reason for referral-coronary calcification  HPI: 73 year old male for evaluation of coronary calcification at request of Zachary Rubins, MD.  Calcium score November 2024 252 which is 56 percentile.  There was note of lung nodules and no follow-up indicated patient low risk; noncontrast chest CT at 12 months if high risk.  There was also note of hepatic cysts and hepatic ultrasound recommended.  Cholesterol panel November 2024 showed total cholesterol 193 with LDL 123.  Patient is very active.  He denies dyspnea on exertion, orthopnea, PND, pedal edema, chest pain or syncope.  Current Outpatient Medications  Medication Sig Dispense Refill   atorvastatin (LIPITOR) 20 MG tablet Take 1 tablet (20 mg total) by mouth daily. 90 tablet 3   losartan (COZAAR) 100 MG tablet Take 1 tablet (100 mg total) by mouth daily. 90 tablet 3   No current facility-administered medications for this visit.    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    Social Drivers of Manufacturing engineer Strain: Not on file  Food Insecurity: Not on file  Transportation Needs: No Transportation Needs (04/08/2022)   PRAPARE - Administrator, Civil Service (Medical): No    Lack of Transportation (Non-Medical): No  Physical Activity: Not on file  Stress: Not on file  Social Connections: Not on file  Intimate Partner Violence: Not on file    Family History  Problem Relation Age of Onset   Heart disease Mother 31   Colon polyps Father    Heart disease Father 73   Heart disease Brother    Heart disease Brother 50   Diabetes Maternal Grandfather    Colon cancer Neg Hx    Thyroid disease Neg Hx    Crohn's disease Neg Hx    Esophageal cancer Neg Hx    Rectal cancer Neg Hx    Stomach cancer Neg Hx    Ulcerative colitis Neg Hx     ROS: no fevers or chills, productive cough, hemoptysis, dysphasia, odynophagia, melena, hematochezia, dysuria, hematuria, rash, seizure activity, orthopnea, PND, pedal edema, claudication. Remaining systems are negative.  Physical Exam:   Blood pressure (!) 158/80, pulse (!) 50, height 5' 10.5" (1.791 m), weight 193 lb (87.5 kg), SpO2 99%.  General:  Well developed/well nourished in NAD Skin warm/dry Patient not depressed No peripheral clubbing Back-normal HEENT-normal/normal eyelids Neck supple/normal carotid upstroke bilaterally; no bruits; no  JVD; no thyromegaly chest - CTA/ normal expansion CV - RRR/normal S1 and S2; no murmurs, rubs or gallops;  PMI nondisplaced Abdomen -NT/ND, no HSM, no mass, + bowel sounds, no bruit 2+ femoral pulses, no bruits Ext-no edema, chords, 2+ DP Neuro-grossly nonfocal  EKG Interpretation Date/Time:  Wednesday May 05 2023 10:09:24 EST Ventricular Rate:  50 PR Interval:  146 QRS Duration:  92 QT Interval:  456 QTC Calculation: 415 R Axis:   48  Text Interpretation: Sinus bradycardia No previous ECGs available Confirmed by [name] ([id number]) on 05/05/2023 10:12:56 AM      A/P  1 coronary calcification-patient with elevated calcium score.  He is asymptomatic with no chest pain and exercises vigorously.  Will plan medical therapy.  Add aspirin 81 mg daily.  Continue statin.  2 pulmonary nodules-plan follow-up chest CT November 2025.  3 hyperlipidemia-given coronary calcification will increase Lipitor to 80 mg daily.  Check lipids and liver.  4 hypertension-blood pressure elevated.  His losartan was recently increased.  He will follow his blood pressure at home and we will advance as needed.  5 liver lesions-plan ultrasound of liver to further assess.  [name], MD

## 2023-04-27 ENCOUNTER — Encounter: Payer: PPO | Admitting: Family Medicine

## 2023-05-01 DIAGNOSIS — C61 Malignant neoplasm of prostate: Secondary | ICD-10-CM | POA: Diagnosis not present

## 2023-05-03 ENCOUNTER — Encounter: Payer: Self-pay | Admitting: Urology

## 2023-05-04 ENCOUNTER — Telehealth: Payer: Self-pay | Admitting: Urology

## 2023-05-04 NOTE — Telephone Encounter (Signed)
Called and LVM regarding his genomic test results.

## 2023-05-05 ENCOUNTER — Telehealth: Payer: Self-pay | Admitting: Urology

## 2023-05-05 ENCOUNTER — Ambulatory Visit: Payer: HMO | Attending: Cardiology | Admitting: Cardiology

## 2023-05-05 ENCOUNTER — Encounter: Payer: Self-pay | Admitting: Cardiology

## 2023-05-05 ENCOUNTER — Other Ambulatory Visit: Payer: Self-pay | Admitting: Urology

## 2023-05-05 VITALS — BP 158/80 | HR 50 | Ht 70.5 in | Wt 193.0 lb

## 2023-05-05 DIAGNOSIS — K7689 Other specified diseases of liver: Secondary | ICD-10-CM | POA: Diagnosis not present

## 2023-05-05 DIAGNOSIS — R931 Abnormal findings on diagnostic imaging of heart and coronary circulation: Secondary | ICD-10-CM

## 2023-05-05 DIAGNOSIS — E782 Mixed hyperlipidemia: Secondary | ICD-10-CM

## 2023-05-05 DIAGNOSIS — R911 Solitary pulmonary nodule: Secondary | ICD-10-CM | POA: Diagnosis not present

## 2023-05-05 DIAGNOSIS — I10 Essential (primary) hypertension: Secondary | ICD-10-CM | POA: Diagnosis not present

## 2023-05-05 DIAGNOSIS — C61 Malignant neoplasm of prostate: Secondary | ICD-10-CM | POA: Insufficient documentation

## 2023-05-05 MED ORDER — ASPIRIN 81 MG PO TBEC: 81.0000 mg | DELAYED_RELEASE_TABLET | Freq: Every day | ORAL | Status: AC

## 2023-05-05 MED ORDER — ATORVASTATIN CALCIUM 80 MG PO TABS: 80.0000 mg | ORAL_TABLET | Freq: Every day | ORAL | 3 refills | Status: AC

## 2023-05-05 NOTE — Telephone Encounter (Signed)
Decipher results discussed with the patient today. Decipher risk group:  Intermediate I discussed the decipher results and that he may not be an ideal candidate for active surveillance given these results. I again discussed treatment options for intermediate risk prostate cancer including radical prostatectomy and radiation therapy. He had questions regarding treatment with CyberKnife and HIFU.  I advised him that I do not have any direct experience with either of these treatment modalities. He would like to consult with radiation oncology at this time. Consultation with Dr. Kathrynn Running requested.

## 2023-05-05 NOTE — Telephone Encounter (Signed)
Pt came in office today wanting to know if Dr had had a chance to look at the genomic test results. Pt would like a call back to go over the results of the test.

## 2023-05-05 NOTE — Patient Instructions (Signed)
Medication Instructions:   INCREASE ATORVASTATIN TO 80 MG ONCE DAILY=4 OF THE 20 MG TABLETS ONCE DAILY  START ASPIRIN 81 MG ONCE DAILY  *If you need a refill on your cardiac medications before your next appointment, please call your pharmacy*   Lab Work:  Your physician recommends that you return for lab work in: [facility]  [employer]  Located on the 3 rd floor in [address] Hours-Monday - Friday 8 am-11:30 AM and 1 pm -4 pm   If you have labs (blood work) drawn today and your tests are completely normal, you will receive your results only by: MyChart Message (if you have MyChart) OR A paper copy in the mail If you have any lab test that is abnormal or we need to change your treatment, we will call you to review the results.   Testing/Procedures:  [facility] 1 ST FLOOR IMAGING DEPARTMENT  CT OF THE CHEST WO CONTRAST IN NOVEMBER TO FOLLOW UP LUNG NODULE   Follow-Up: At [facility], you and your health needs are our priority.  As part of our continuing mission to provide you with exceptional heart care, we have created designated Provider Care Teams.  These Care Teams include your primary Cardiologist (physician) and Advanced Practice Providers (APPs -  Physician Assistants and Nurse Practitioners) who all work together to provide you with the care you need, when you need it.     Your next appointment:   12 month(s)  Provider:   [name], MD

## 2023-05-06 ENCOUNTER — Telehealth: Payer: Self-pay | Admitting: Radiation Oncology

## 2023-05-06 NOTE — Telephone Encounter (Signed)
Left message for patient to call back to schedule consult per 2/19 referral.

## 2023-05-12 ENCOUNTER — Telehealth: Payer: Self-pay | Admitting: Cardiology

## 2023-05-12 NOTE — Telephone Encounter (Signed)
 Patient was calling because Dr. Jens Som had place two orders for the patient. Patient is scheduled for a scan on 05/17/23, but was informed the scan was in regard to a cyst on the liver. Patient was unaware of this information and would like more clarification on when and how that was detected. Patient stated the Chest CT Scan was scheduled for 11/03. Patient would like to know if he is allowed to wait that long and would like clarification as to why it is not scheduled for a sooner date. Please advise.

## 2023-05-12 NOTE — Telephone Encounter (Signed)
 Patient identification verified by 2 forms. Marilynn Rail, RN    Called and spoke to patient  Patient states:   -was not aware of nodule or liver cyst   -would like to know when these were found   -would like to know why CT is in a year  Informed patient:   -findings were seen on cardiac scoring from 01/2023  -follow up for nodule is for 1 year  Patient verbalized understanding, no questions at this time

## 2023-05-17 ENCOUNTER — Ambulatory Visit (HOSPITAL_BASED_OUTPATIENT_CLINIC_OR_DEPARTMENT_OTHER)
Admission: RE | Admit: 2023-05-17 | Discharge: 2023-05-17 | Disposition: A | Discharge status: Home / Self Care | Payer: HMO | Source: Ambulatory Visit | Attending: Cardiology | Admitting: Cardiology

## 2023-05-17 DIAGNOSIS — K838 Other specified diseases of biliary tract: Secondary | ICD-10-CM | POA: Diagnosis not present

## 2023-05-17 DIAGNOSIS — K7689 Other specified diseases of liver: Secondary | ICD-10-CM | POA: Diagnosis not present

## 2023-05-17 DIAGNOSIS — K824 Cholesterolosis of gallbladder: Secondary | ICD-10-CM | POA: Diagnosis not present

## 2023-05-19 ENCOUNTER — Telehealth: Payer: Self-pay | Admitting: Cardiology

## 2023-05-19 NOTE — Telephone Encounter (Signed)
 Patient came in asking for a call back in regards to his abdominal US he had done on Monday. CB # [phone]

## 2023-05-19 NOTE — Telephone Encounter (Signed)
 Liver ultrasound shows mild fatty liver but no other abnormalities.  Zachary Duncan   Spoke with pt, aware of the above.

## 2023-05-20 NOTE — Progress Notes (Signed)
 Name: Zachary Duncan  MRN/ DOB: [id number], May 19, 1950    Age/ Sex: 73 y.o., male    PCP: Loyola Mast, MD   Reason for Endocrinology Evaluation: Hypothyroidism     Date of Initial Endocrinology Evaluation: 05/21/2023     HPI: [name] is a 73 y.o. male with a past medical history of HTN, Dyslipidemia. The patient presented for initial endocrinology clinic visit on 05/21/2023 for consultative assistance with his Hypothyroidism.   Pt has been diagnosed with hypothyroidism since 2008  Patient has been noted with elevated TPO antibodies in 01/2023  He was tried on Levothyroxine but developed abnormal chest spasms so he discontinued   Pt follows with cardiology for HTN and dyslipidemia  He also follows with urology for elevated PSA   Denies local neck swelling  Denies palpitations  Denies fatigue  Denies constipation  Denies depression    No Fh of thyroid disease   HISTORY:  Past Medical History:  Past Medical History:  Diagnosis Date   Dermatophytosis of nail 05/10/2007   Hyperlipidemia    Hypothyroidism    Past Surgical History:  Past Surgical History:  Procedure Laterality Date   COLONOSCOPY W/ BIOPSIES AND POLYPECTOMY      Social History:  reports that he has never smoked. He has never used smokeless tobacco. He reports current alcohol use of about 1.0 standard drink of alcohol per week. He reports that he does not use drugs. Family History: family history includes Colon polyps in his father; Diabetes in his maternal grandfather; Heart disease in his brother; Heart disease (age of onset: 109) in his mother; Heart disease (age of onset: 9) in his father; Heart disease (age of onset: 49) in his brother.   HOME MEDICATIONS: Allergies as of 05/21/2023   No Known Allergies      Medication List        Accurate as of May 21, 2023 10:06 AM. If you have any questions, ask your nurse or doctor.          aspirin EC 81 MG tablet Take 1 tablet  (81 mg total) by mouth daily. Swallow whole.   atorvastatin 80 MG tablet Commonly known as: LIPITOR Take 1 tablet (80 mg total) by mouth daily.   losartan 100 MG tablet Commonly known as: COZAAR Take 1 tablet (100 mg total) by mouth daily.          REVIEW OF SYSTEMS: A comprehensive ROS was conducted with the patient and is negative except as per HPI  OBJECTIVE:  VS: BP 120/80 (BP Location: Left Arm, Patient Position: Sitting, Cuff Size: Small)   Pulse (!) 57   Ht 5' 10.5" (1.791 m)   Wt 192 lb 6.4 oz (87.3 kg)   SpO2 97%   BMI 27.22 kg/m    Wt Readings from Last 3 Encounters:  05/21/23 192 lb 6.4 oz (87.3 kg)  05/05/23 193 lb (87.5 kg)  03/22/23 191 lb 12.8 oz (87 kg)     EXAM: General: Pt appears well and is in NAD  Neck: General: Supple without adenopathy. Thyroid: Thyroid size normal.  No goiter or nodules appreciated.   Lungs: Clear with good BS bilat   Heart: Auscultation: RRR.  Abdomen: Soft, nontender  Extremities:  BL LE: No pretibial edema   Mental Status: Judgment, insight: Intact Orientation: Oriented to time, place, and person Mood and affect: No depression, anxiety, or agitation     DATA REVIEWED:     Latest Reference  Range & Units 05/21/23 10:34  TSH 0.40 - 4.50 mIU/L 9.78 (H)  T4,Free(Direct) 0.8 - 1.8 ng/dL 1.1  (H): Data is abnormally high  Latest Reference Range & Units 01/21/23 09:41  Anti-TPO Ab (RDL) <9.0 IU/mL 37.8 (H)    Old records , labs and images have been reviewed.   ASSESSMENT/PLAN/RECOMMENDATIONS:   Hashimoto's thyroiditis:  -Patient is clinically euthyroid -I explained to the patient that Hashimoto's Disease is an autoimmune - mediated destruction of the thyroid gland. The usual course of Hashimoto's thyroiditis is the gradual loss of thyroid function.  -He has tried levothyroxine, but caused left-sided chest spasms - TSH trending up but we opted to remain off levothyroxine     Follow-up in 6 months  Signed  electronically by: Lyndle Herrlich, MD  [facility]  [facility] [address] [place] [place], [place] [address] Phone: [phone] FAX: [phone]   CC: Loyola Mast, MD [address] [place] [address] Phone: [phone] Fax: [phone]   Return to Endocrinology clinic as below: Future Appointments  Date Time Provider Department Center  05/24/2023  8:00 AM [facility] NURSE [facility] None  05/24/2023  8:30 AM Margaretmary Dys, MD [facility] None  06/21/2023  3:00 PM Loyola Mast, MD [facility]  01/17/2024  9:30 AM [facility]

## 2023-05-20 NOTE — Progress Notes (Signed)
 Radiation Oncology         [phone] ________________________________  Initial Outpatient Consultation  Name: Zachary Duncan MRN: [id number]  Date: 05/24/2023  DOB: 1950/07/24  [id number], Bertram Millard, MD  Milderd Meager., *   REFERRING PHYSICIAN: Milderd Meager., *  DIAGNOSIS: 73 y.o. gentleman with Stage T1c adenocarcinoma of the prostate with Gleason score of 3+4, and PSA of 5.49.    ICD-10-CM   1. Malignant neoplasm of prostate (HCC)  C61       HISTORY OF PRESENT ILLNESS: Zachary Duncan is a 73 y.o. male with a diagnosis of prostate cancer. He was noted to have an elevated PSA of 5.49 by his primary care physician, Dr. Doreene Burke.  Accordingly, he was referred for evaluation in urology by Dr. Pete Glatter on 02/18/23,  digital rectal examination performed at that time showed no nodules or induration. ISO PSA score was 11.5, indicating an increased risk of high grade disease (> 3+4), total PSA was stable elevated at 5.32. He underwent prostate MRI on 03/23/23 showing: PI-RADS 5 lesion of left anterior transition zone in base and mid gland. The patient proceeded to MRI fusion biopsy of the prostate on 04/12/23 under the care of Dr. Annabell Howells.  The prostate volume measured 35 cc.  Out of 15 core biopsies, 4 were positive.  The maximum Gleason score was 3+4, and this was seen in the left mid (small focus) and all three samples from the MRI ROI. Decipher genomic test returned a score of 0.52, indicating intermediate risk disease, consistent with pathology.  The patient reviewed the biopsy results with his urologist and he has kindly been referred today for discussion of potential radiation treatment options.   PREVIOUS RADIATION THERAPY: No  PAST MEDICAL HISTORY:  Past Medical History:  Diagnosis Date   Dermatophytosis of nail 05/10/2007   Elevated PSA    Hyperlipidemia    Hypothyroidism       PAST SURGICAL HISTORY: Past Surgical History:  Procedure Laterality Date    COLONOSCOPY W/ BIOPSIES AND POLYPECTOMY      FAMILY HISTORY:  Family History  Problem Relation Age of Onset   Heart disease Mother 103   Colon polyps Father    Heart disease Father 71   Heart disease Brother    Heart disease Brother 31   Diabetes Maternal Grandfather    Colon cancer Neg Hx    Thyroid disease Neg Hx    Crohn's disease Neg Hx    Esophageal cancer Neg Hx    Rectal cancer Neg Hx    Stomach cancer Neg Hx    Ulcerative colitis Neg Hx     SOCIAL HISTORY:  Social History   Socioeconomic History   Marital status: Divorced    Spouse name: Not on file   Number of children: 1   Years of education: 14   Highest education level: Not on file  Occupational History   Occupation: Oncologist  Tobacco Use   Smoking status: Never   Smokeless tobacco: Never  Vaping Use   Vaping status: Never Used  Substance and Sexual Activity   Alcohol use: Yes    Alcohol/week: 1.0 standard drink of alcohol    Types: 1 Standard drinks or equivalent per week    Comment: rare   Drug use: No   Sexual activity: Not Currently  Other Topics Concern   Not on file  Social History Narrative   Fun: Tennis when able    Denies religious beliefs effecting health care.  Social Drivers of Corporate investment banker Strain: Not on file  Food Insecurity: No Food Insecurity (05/24/2023)   Hunger Vital Sign    Worried About Running Out of Food in the Last Year: Never true    Ran Out of Food in the Last Year: Never true  Transportation Needs: No Transportation Needs (05/24/2023)   PRAPARE - Administrator, Civil Service (Medical): No    Lack of Transportation (Non-Medical): No  Physical Activity: Not on file  Stress: Not on file  Social Connections: Not on file  Intimate Partner Violence: Not At Risk (05/24/2023)   Humiliation, Afraid, Rape, and Kick questionnaire    Fear of Current or Ex-Partner: No    Emotionally Abused: No    Physically Abused: No    Sexually Abused:  No    ALLERGIES: Patient has no known allergies.  MEDICATIONS:  Current Outpatient Medications  Medication Sig Dispense Refill   aspirin EC 81 MG tablet Take 1 tablet (81 mg total) by mouth daily. Swallow whole.     atorvastatin (LIPITOR) 80 MG tablet Take 1 tablet (80 mg total) by mouth daily. 90 tablet 3   losartan (COZAAR) 100 MG tablet Take 1 tablet (100 mg total) by mouth daily. 90 tablet 3   No current facility-administered medications for this encounter.    REVIEW OF SYSTEMS:  On review of systems, the patient reports that he is doing well overall. He denies any chest pain, shortness of breath, cough, fevers, chills, night sweats, unintended weight changes. He denies any bowel disturbances, and denies abdominal pain, nausea or vomiting. He denies any new musculoskeletal or joint aches or pains. His IPSS was 4, indicating minimal urinary symptoms. His SHIM was 24, indicating he does not have erectile dysfunction. A complete review of systems is obtained and is otherwise negative.    PHYSICAL EXAM:  Wt Readings from Last 3 Encounters:  05/24/23 188 lb 8 oz (85.5 kg)  05/21/23 192 lb 6.4 oz (87.3 kg)  05/05/23 193 lb (87.5 kg)   Temp Readings from Last 3 Encounters:  05/24/23 (!) 97.3 F (36.3 C) (Temporal)  03/22/23 97.6 F (36.4 C) (Temporal)  01/21/23 97.7 F (36.5 C) (Temporal)   BP Readings from Last 3 Encounters:  05/24/23 (!) 150/80  05/21/23 120/80  05/05/23 (!) 158/80   Pulse Readings from Last 3 Encounters:  05/24/23 (!) 48  05/21/23 (!) 57  05/05/23 (!) 50   Pain Assessment Pain Score: 0-No pain/10  In general this is a well appearing Caucqasian male in no acute distress. He's alert and oriented x4 and appropriate throughout the examination. Cardiopulmonary assessment is negative for acute distress, and he exhibits normal effort.     KPS = 100  100 - Normal; no complaints; no evidence of disease. 90   - Able to carry on normal activity; minor signs or  symptoms of disease. 80   - Normal activity with effort; some signs or symptoms of disease. 73   - Cares for self; unable to carry on normal activity or to do active work. 60   - Requires occasional assistance, but is able to care for most of his personal needs. 50   - Requires considerable assistance and frequent medical care. 40   - Disabled; requires special care and assistance. 30   - Severely disabled; hospital admission is indicated although death not imminent. 20   - Very sick; hospital admission necessary; active supportive treatment necessary. 10   - Moribund;  fatal processes progressing rapidly. 0     - Dead  Karnofsky DA, Abelmann WH, Craver LS and Burchenal [facility] [phone]) The use of the nitrogen mustards in the palliative treatment of carcinoma: with particular reference to bronchogenic carcinoma Cancer 1 [id number]  LABORATORY DATA:  Lab Results  Component Value Date   WBC 6.3 01/21/2023   HGB 15.5 01/21/2023   HCT 45.7 01/21/2023   MCV 93.6 01/21/2023   PLT 216.0 01/21/2023   Lab Results  Component Value Date   NA 139 01/21/2023   K 4.0 01/21/2023   CL 102 01/21/2023   CO2 29 01/21/2023   Lab Results  Component Value Date   ALT 21 01/21/2023   AST 17 01/21/2023   ALKPHOS 107 01/21/2023   BILITOT 2.1 (H) 01/21/2023     RADIOGRAPHY: US ABDOMEN LIMITED RUQ (LIVER/GB) Result Date: 05/17/2023 CLINICAL DATA:  Liver cyst EXAM: ULTRASOUND ABDOMEN LIMITED RIGHT UPPER QUADRANT COMPARISON:  None Available. FINDINGS: Gallbladder: Note is made of a 5 mm gallbladder polyp. No gallbladder wall thickening or pericholecystic fluid. Small amount of sludge in the gallbladder lumen. Common bile duct: Diameter: 1.3 mm Liver: Increased echogenicity. There is a 2.4 cm hepatic cyst. The other described lesions on prior CT are not visualized. Portal vein is patent on color Doppler imaging with normal direction of blood flow towards the liver. Other: None. IMPRESSION: 1. Increased hepatic parenchymal  echogenicity suggestive of steatosis. 2. There is a 2.4 cm hepatic cyst. The other described lesions on prior CT are not visualized. 3. There is a 5 mm gallbladder polyp. No imaging follow-up needed. 4. Small amount of sludge in the gallbladder lumen. No secondary signs of acute cholecystitis. Electronically Signed   By: Annia Belt M.D.   On: 05/17/2023 11:36      IMPRESSION/PLAN: 1. 73 y.o. gentleman with Stage T1c adenocarcinoma of the prostate with Gleason Score of 3+4, and PSA of 5.49. We discussed the patient's workup and outlined the nature of prostate cancer in this setting. The patient's T stage, Gleason's score, and PSA put him into the favorable intermediate risk group. Accordingly, he is eligible for a variety of potential treatment options including active surveillance, brachytherapy, 5.5 weeks of external radiation, or prostatectomy. We discussed the available radiation techniques, and focused on the details and logistics of delivery. We discussed and outlined the risks, benefits, short and long-term effects associated with radiotherapy and compared and contrasted these with prostatectomy. We discussed the role of SpaceOAR gel in reducing the rectal toxicity associated with radiotherapy.  He appears to have a good understanding of his disease and our treatment recommendations which are of curative intent.  He was encouraged to ask questions that were answered to his stated satisfaction.  At the conclusion of our conversation, the patient is interested in moving forward with his scheduled consult visit at [facility] on 05/25/23 to discuss CyberKnife SBRT prostate treatment. While we have the technical capability to offer SBRT to curative prostate cancer patients, we have not implemented this program in the community setting awaiting NRG randomized trial results and some longer term data.  He has our contact information and will let us know once he reaches a final decision so that we can proceed with  treatment planning accordingly. He appears to be leaning towards 5.5 weeks of daily external beam radiation so we will share our discussion with Dr. Pete Glatter and await his final decision. We enjoyed meeting him today and look forward to continuing to participate in his care.  We personally spent 70 minutes in this encounter including chart review, reviewing radiological studies, meeting face-to-face with the patient, entering orders and completing documentation.    Marguarite Arbour, PA-C    Margaretmary Dys, MD  [facility]  Radiation Oncology Direct Dial: [phone]  Fax: [phone] [email or web address]  Skype  [email or web address]   This document serves as a record of services personally performed by Margaretmary Dys, MD and Marcello Fennel, PA-C. It was created on their behalf by Mickie Bail, a trained medical scribe. The creation of this record is based on the scribe's personal observations and the provider's statements to them. This document has been checked and approved by the attending provider.

## 2023-05-21 ENCOUNTER — Encounter: Payer: Self-pay | Admitting: Internal Medicine

## 2023-05-21 ENCOUNTER — Ambulatory Visit: Payer: PPO | Admitting: Internal Medicine

## 2023-05-21 VITALS — BP 120/80 | HR 57 | Ht 70.5 in | Wt 192.4 lb

## 2023-05-21 DIAGNOSIS — E063 Autoimmune thyroiditis: Secondary | ICD-10-CM

## 2023-05-21 LAB — TSH: TSH: 9.78 m[IU]/L — ABNORMAL HIGH (ref 0.40–4.50)

## 2023-05-21 LAB — T4, FREE: Free T4: 1.1 ng/dL (ref 0.8–1.8)

## 2023-05-24 ENCOUNTER — Encounter: Payer: Self-pay | Admitting: Internal Medicine

## 2023-05-24 ENCOUNTER — Encounter: Payer: Self-pay | Admitting: Radiation Oncology

## 2023-05-24 ENCOUNTER — Ambulatory Visit
Admission: RE | Admit: 2023-05-24 | Discharge: 2023-05-24 | Disposition: A | Discharge status: Home / Self Care | Payer: HMO | Source: Ambulatory Visit | Attending: Radiation Oncology | Admitting: Radiation Oncology

## 2023-05-24 VITALS — BP 142/74 | HR 48 | Temp 97.3°F | Resp 18 | Ht 70.5 in | Wt 188.5 lb

## 2023-05-24 DIAGNOSIS — E785 Hyperlipidemia, unspecified: Secondary | ICD-10-CM | POA: Diagnosis not present

## 2023-05-24 DIAGNOSIS — Z7982 Long term (current) use of aspirin: Secondary | ICD-10-CM | POA: Diagnosis not present

## 2023-05-24 DIAGNOSIS — K824 Cholesterolosis of gallbladder: Secondary | ICD-10-CM | POA: Insufficient documentation

## 2023-05-24 DIAGNOSIS — K7689 Other specified diseases of liver: Secondary | ICD-10-CM | POA: Insufficient documentation

## 2023-05-24 DIAGNOSIS — E039 Hypothyroidism, unspecified: Secondary | ICD-10-CM | POA: Diagnosis not present

## 2023-05-24 DIAGNOSIS — Z79899 Other long term (current) drug therapy: Secondary | ICD-10-CM | POA: Insufficient documentation

## 2023-05-24 DIAGNOSIS — C61 Malignant neoplasm of prostate: Secondary | ICD-10-CM | POA: Diagnosis not present

## 2023-05-24 DIAGNOSIS — Z191 Hormone sensitive malignancy status: Secondary | ICD-10-CM | POA: Diagnosis not present

## 2023-05-24 HISTORY — DX: Elevated prostate specific antigen (PSA): R97.20

## 2023-05-24 NOTE — Addendum Note (Signed)
 Encounter addended by: Roel Cluck, RN on: 05/24/2023 9:38 AM  Actions taken: Vitals modified

## 2023-05-24 NOTE — Progress Notes (Signed)
 GU Location of Tumor / Histology: Prostate Ca  If Prostate Cancer, Gleason Score is (3 + 4) and PSA is (5.49)  Zachary Duncan presented as referral elevated PSA of 5.49 by his primary care physician, Dr. Doreene Burke.   Biopsies   Prostate MRI on 03/23/23 showing: PI-RADS 5 lesion of left anterior transition zone in base and mid gland. The patient proceeded to MRI fusion biopsy of the prostate on 04/12/23 under Dr. Annabell Howells.  The prostate volume measured 35 cc.  Out of 15 core biopsies, 4 were positive.   03/23/2023 MR Prostate with/without Contrast Dr. Di Kindle CLINICAL DATA: Elevated PSA level. R97.20   IMPRESSION: 1. PI-RADS category 5 lesion of the left anterior transition zone in the base and mid gland. Targeting data sent to [facility]. 2. Mild benign prostatic hypertrophy. 3. Atheromatous plaque in the common iliac arteries bilaterally.    Past/Anticipated interventions by urology, if any: NA  Past/Anticipated interventions by medical oncology, if any: NA  Weight changes, if any: No  IPSS:  4 SHIM:  24  Bowel/Bladder complaints, if any:  No  Nausea/Vomiting, if any: No  Pain issues, if any:  0/10  SAFETY ISSUES: Prior radiation? No Pacemaker/ICD? No Possible current pregnancy? Male Is the patient on methotrexate?  No  Current Complaints / other details:

## 2023-05-24 NOTE — Progress Notes (Signed)
 Introduced myself to the patient as the prostate nurse navigator.  No barriers to care identified at this time.  He is here to discuss his radiation treatment options, and does have an appointment on 3/11 to learn more about SBRT.  Patient agreeable for follow up to finalize treatment decision.  I gave him my business card and asked him to call me with questions or concerns.  Verbalized understanding.

## 2023-05-25 DIAGNOSIS — C61 Malignant neoplasm of prostate: Secondary | ICD-10-CM | POA: Diagnosis not present

## 2023-05-27 NOTE — Progress Notes (Signed)
 Patient had consult at [facility] with Dr. Camelia Phenes on 3/11.  RN spoke with patient to follow up with treatment decision.   Patient is still debating if he wants to pursue 5.5 weeks vs SBRT.  All questions answered.  Patient agreeable for follow up.

## 2023-05-31 DIAGNOSIS — C61 Malignant neoplasm of prostate: Secondary | ICD-10-CM | POA: Diagnosis not present

## 2023-06-04 NOTE — Progress Notes (Signed)
 Patient was a consult on 3/10 for his stage T1c adenocarcinoma of the prostate with Gleason score of 3+4, and PSA of 5.49 and was undecided regarding treatment decision between 5.5 weeks vs SBRT.    Patient will proceed with treatment at [facility] for SBRT.

## 2023-06-21 ENCOUNTER — Ambulatory Visit (INDEPENDENT_AMBULATORY_CARE_PROVIDER_SITE_OTHER): Payer: PPO | Admitting: Family Medicine

## 2023-06-21 ENCOUNTER — Encounter: Payer: Self-pay | Admitting: Family Medicine

## 2023-06-21 VITALS — BP 126/64 | HR 46 | Temp 97.8°F | Ht 70.5 in | Wt 189.6 lb

## 2023-06-21 DIAGNOSIS — E038 Other specified hypothyroidism: Secondary | ICD-10-CM | POA: Diagnosis not present

## 2023-06-21 DIAGNOSIS — E782 Mixed hyperlipidemia: Secondary | ICD-10-CM

## 2023-06-21 DIAGNOSIS — I10 Essential (primary) hypertension: Secondary | ICD-10-CM | POA: Diagnosis not present

## 2023-06-21 DIAGNOSIS — C61 Malignant neoplasm of prostate: Secondary | ICD-10-CM

## 2023-06-21 NOTE — Assessment & Plan Note (Signed)
 Continue atorvastatin 80 mg daily as recommended by cardiology.

## 2023-06-21 NOTE — Assessment & Plan Note (Signed)
 Blood pressure is now at goal. Continue losartan 100 mg daily.

## 2023-06-21 NOTE — Assessment & Plan Note (Signed)
 Longstanding mildly elevated TSH with normal T4. TPO antibodies positive, indicative of Hashimoto's thyroiditis. We will continue to monitor this off of levothyroxine for now.

## 2023-06-21 NOTE — Progress Notes (Signed)
 [facility] [address] [place] [address] Dept: [phone] Dept Fax: [phone]  Chronic Care Office Visit  Subjective:    Patient ID: Zachary Duncan, male    DOB: 19-Jun-1950, 73 y.o..   MRN: [id number]  Chief Complaint  Patient presents with   Hypertension    3 month f/u    History of Present Illness:  Patient is in today for reassessment of chronic medical issues.  [name] has a history of hypertension. He is managed on losartan 100 mg daily (increased at his last visit).    [name] has a history of dyslipidemia. He had a coronary calcium scan in Nov. I had started him on atorvastatin 20 mg daily at his last viis.t Cardiology has increased this to 80 mg daily.   [name] has a long-term history of a mildly elevated TSH. He has seen Dr. Lonzo Cloud. As he had some chest symptoms with taking levothyroxine int he past, she has decided to monitor this off of any thyroid hormone replacement at this point.   [name] has been diagnosed with prostate cancer. He is now being seen at [facility]. He plans to undergo therapy with a Cyber Knife procedure, likely in July.  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   Pulmonary nodules 02/28/2023   Elevated coronary artery calcium score 02/16/2023   Essential hypertension 01/08/2023   Blurry vision, left eye 01/08/2023   Elevated PSA 02/02/2019   Subclinical hypothyroidism 02/02/2019   Excessive cerumen in both ear canals 01/23/2019   History of UTI 01/23/2019   Hyperlipidemia    Finger joint swelling 10/16/2010   Dermatophytosis of nail 05/10/2007   Past Surgical History:  Procedure Laterality Date   COLONOSCOPY W/ BIOPSIES AND POLYPECTOMY     Family History  Problem  Relation Age of Onset   Heart disease Mother 39   Colon polyps Father    Heart disease Father 55   Heart disease Brother    Heart disease Brother 22   Diabetes Maternal Grandfather    Colon cancer Neg Hx    Thyroid disease Neg Hx    Crohn's disease Neg Hx    Esophageal cancer Neg Hx    Rectal cancer Neg Hx    Stomach cancer Neg Hx    Ulcerative colitis Neg Hx    Outpatient Medications Prior to Visit  Medication Sig Dispense Refill   aspirin EC 81 MG tablet Take 1 tablet (81 mg total) by mouth daily. Swallow whole.     atorvastatin (LIPITOR) 80 MG tablet Take 1 tablet (80 mg total) by mouth daily. 90 tablet 3   losartan (COZAAR) 100 MG tablet Take 1 tablet (100 mg total) by mouth daily. 90 tablet 3   No facility-administered medications prior to visit.   No Known Allergies Objective:   Today's Vitals   06/21/23 1453  BP: 124/62  Pulse: (!) 46  Temp: 97.8 F (36.6 C)  TempSrc: Temporal  SpO2: 100%  Weight: 189 lb 9.6 oz (86 kg)  Height: 5' 10.5" (1.791 m)   Body mass index is 26.82 kg/m.   General: Well developed, well nourished. No acute distress. Psych: Alert and oriented. Normal mood and affect.  Health Maintenance Due  Topic Date Due   Medicare Annual Wellness (AWV)  Never done   DTaP/Tdap/Td (1 - Tdap) Never done     Assessment & Plan:   Problem List Items Addressed This Visit       Cardiovascular and Mediastinum   Essential hypertension - Primary   Blood pressure is now at goal. Continue losartan 100 mg daily.        Endocrine   Subclinical hypothyroidism   Longstanding mildly elevated TSH with normal T4. TPO antibodies positive, indicative of Hashimoto's thyroiditis. We will continue to monitor this off of levothyroxine for now.        Genitourinary   Prostate cancer (HCC); PSA 5.49; GG 2; fav int risk; Decipher int risk   Planning for radiation therapy this summer.        Other   Hyperlipidemia   Continue atorvastatin 80 mg daily as  recommended by cardiology.       Return in about 14 weeks (around 09/27/2023) for Reassessment.   Loyola Mast, MD

## 2023-06-21 NOTE — Assessment & Plan Note (Signed)
 Planning for radiation therapy this summer.

## 2023-08-18 ENCOUNTER — Encounter: Payer: Self-pay | Admitting: *Deleted

## 2023-08-24 DIAGNOSIS — C61 Malignant neoplasm of prostate: Secondary | ICD-10-CM | POA: Diagnosis not present

## 2023-08-30 ENCOUNTER — Ambulatory Visit: Payer: Self-pay

## 2023-08-30 NOTE — Telephone Encounter (Signed)
  Patient seeking a Flomax prescription. Pt needs Flomax prior to upcoming surgery. Provider at [facility] has been attempting to send Flomax to patients preferred pharmacy, and the pharmacy is having issues retrieving the e-script.  Patient is requesting MD Rudd send the prescription instead.  This nurse informed the patient that since the original prescription was written by a [facility] provider, it will have to come from them. Patient verbalized understanding. No additional questions/concerns noted during the time of the call.   ----------------------------------------------------------------   Copied from CRM [id number]. Topic: Clinical - Prescription Issue >> Aug 30, 2023  5:03 PM Jim Motts C wrote: Reason for CRM: Prescription filled for Flomax and [facility] said they still dont have the prescription. Called [facility] three different times and [facility] said they [facility] from [facility], Dr. Mackie Sayre. Patient has a surgery scheduled for Monday.

## 2023-09-06 DIAGNOSIS — C61 Malignant neoplasm of prostate: Secondary | ICD-10-CM | POA: Diagnosis not present

## 2023-09-14 DIAGNOSIS — N3289 Other specified disorders of bladder: Secondary | ICD-10-CM | POA: Diagnosis not present

## 2023-09-14 DIAGNOSIS — C61 Malignant neoplasm of prostate: Secondary | ICD-10-CM | POA: Diagnosis not present

## 2023-09-14 DIAGNOSIS — K4020 Bilateral inguinal hernia, without obstruction or gangrene, not specified as recurrent: Secondary | ICD-10-CM | POA: Diagnosis not present

## 2023-09-22 DIAGNOSIS — C61 Malignant neoplasm of prostate: Secondary | ICD-10-CM | POA: Diagnosis not present

## 2023-09-27 ENCOUNTER — Ambulatory Visit: Admitting: Family Medicine

## 2023-10-01 DIAGNOSIS — C61 Malignant neoplasm of prostate: Secondary | ICD-10-CM | POA: Diagnosis not present

## 2023-10-14 DIAGNOSIS — C61 Malignant neoplasm of prostate: Secondary | ICD-10-CM | POA: Diagnosis not present

## 2023-11-22 ENCOUNTER — Encounter: Payer: Self-pay | Admitting: Internal Medicine

## 2023-11-22 ENCOUNTER — Ambulatory Visit: Admitting: Internal Medicine

## 2023-11-22 VITALS — BP 120/70 | HR 54 | Ht 70.5 in | Wt 184.0 lb

## 2023-11-22 DIAGNOSIS — E063 Autoimmune thyroiditis: Secondary | ICD-10-CM

## 2023-11-22 LAB — T4, FREE: Free T4: 1.1 ng/dL (ref 0.8–1.8)

## 2023-11-22 LAB — TSH: TSH: 7.91 m[IU]/L — ABNORMAL HIGH (ref 0.40–4.50)

## 2023-11-22 LAB — T3, FREE: T3, Free: 2.8 pg/mL (ref 2.3–4.2)

## 2023-11-22 NOTE — Progress Notes (Unsigned)
 Name: Zachary Duncan  MRN/ DOB: [id number], 1950-07-12    Age/ Sex: 73 y.o., male    PCP: Thedora Garnette HERO, MD   Reason for Endocrinology Evaluation: Hypothyroidism     Date of Initial Endocrinology Evaluation: 05/21/2023    HPI: [name] is a 73 y.o. male with a past medical history of HTN, Dyslipidemia. The patient presented for initial endocrinology clinic visit on 05/21/2023 for consultative assistance with his Hypothyroidism.   Pt has been diagnosed with hypothyroidism since 2008  Patient has been noted with elevated TPO antibodies in 01/2023  He was tried on Levothyroxine  but developed abnormal chest spasms so he discontinued   Pt follows with cardiology for HTN and dyslipidemia  He also follows with urology for elevated PSA   No Fh of thyroid  disease  On his initial visit to our clinic his TSH was 9.78 u IU/ML, and we opted to hold off on levothyroxine  as long as his TSH is <10.0uIU/mL   SUBJECTIVE:    Today (11/22/23):  Zachary Duncan is here for follow-up on Hashimoto's disease.  Patient has been noted weight loss over the past 6 months Has been playing pickleball  Completed radiation treatment for Prostate cancer , last treatment 7/24th  Appetite is good  No local neck swelling  No palpitations  No constipation  Denies depression or anxiety  No tremors  Energy level is stable , has noted slight decrease in energy the last week     HISTORY:  Past Medical History:  Past Medical History:  Diagnosis Date   Dermatophytosis of nail 05/10/2007   Elevated PSA    Hyperlipidemia    Hypothyroidism    Prostate cancer [facility])    Past Surgical History:  Past Surgical History:  Procedure Laterality Date   COLONOSCOPY W/ BIOPSIES AND POLYPECTOMY      Social History:  reports that he has never smoked. He has never used smokeless tobacco. He reports current alcohol use of about 1.0 standard drink of alcohol per week. He reports that he does not use  drugs. Family History: family history includes Colon polyps in his father; Diabetes in his maternal grandfather; Heart disease in his brother; Heart disease (age of onset: 40) in his mother; Heart disease (age of onset: 12) in his father; Heart disease (age of onset: 81) in his brother.   HOME MEDICATIONS: Allergies as of 11/22/2023   No Known Allergies      Medication List        Accurate as of November 22, 2023 10:41 AM. If you have any questions, ask your nurse or doctor.          aspirin  EC 81 MG tablet Take 1 tablet (81 mg total) by mouth daily. Swallow whole.   atorvastatin  80 MG tablet Commonly known as: LIPITOR Take 1 tablet (80 mg total) by mouth daily.   losartan  100 MG tablet Commonly known as: COZAAR  Take 1 tablet (100 mg total) by mouth daily.          REVIEW OF SYSTEMS: A comprehensive ROS was conducted with the patient and is negative except as per HPI  OBJECTIVE:  VS: BP 120/70 (BP Location: Left Arm, Patient Position: Sitting, Cuff Size: Normal)   Pulse (!) 54   Ht 5' 10.5 (1.791 m)   Wt 184 lb (83.5 kg)   SpO2 98%   BMI 26.03 kg/m    Wt Readings from Last 3 Encounters:  11/22/23 184 lb (83.5 kg)  06/21/23 189 lb 9.6 oz (86 kg)  05/24/23 188 lb 8 oz (85.5 kg)     EXAM: General: Pt appears well and is in NAD  Neck: General: Supple without adenopathy. Thyroid : Thyroid  size normal.  No goiter or nodules appreciated.   Lungs: Clear with good BS bilat   Heart: Auscultation: RRR.  Abdomen: Soft, nontender  Extremities:  BL LE: No pretibial edema   Mental Status: Judgment, insight: Intact Orientation: Oriented to time, place, and person Mood and affect: No depression, anxiety, or agitation     DATA REVIEWED:      Latest Reference Range & Units 11/22/23 10:57  TSH 0.40 - 4.50 mIU/L 7.91 (H)  Triiodothyronine,Free,Serum 2.3 - 4.2 pg/mL 2.8  T4,Free(Direct) 0.8 - 1.8 ng/dL 1.1  (H): Data is abnormally high  Latest Reference Range &  Units 01/21/23 09:41  Anti-TPO Ab (RDL) <9.0 IU/mL 37.8 (H)    Old records , labs and images have been reviewed.   ASSESSMENT/PLAN/RECOMMENDATIONS:   Hashimoto's thyroiditis:  -Patient is clinically euthyroid -I explained to the patient that Hashimoto's Disease is an autoimmune - mediated destruction of the thyroid  gland. The usual course of Hashimoto's thyroiditis is the gradual loss of thyroid  function.  -He has tried levothyroxine , but caused left-sided chest spasms - TFTs show elevated TSH, but is trending down with normal free T4 and T3, no intervention   Follow-up in 6 months  Signed electronically by: Stefano Redgie Butts, MD  [facility]  [facility] [address] [place] [place], [place] [address] Phone: [phone] FAX: [phone]   CC: Thedora Garnette HERO, MD [address] [place] [address] Phone: [phone] Fax: [phone]   Return to Endocrinology clinic as below: Future Appointments  Date Time Provider Department Center  01/17/2024  9:30 AM [facility]

## 2023-11-23 ENCOUNTER — Ambulatory Visit: Payer: Self-pay | Admitting: Internal Medicine

## 2023-12-07 DIAGNOSIS — D2261 Melanocytic nevi of right upper limb, including shoulder: Secondary | ICD-10-CM | POA: Diagnosis not present

## 2023-12-07 DIAGNOSIS — D2362 Other benign neoplasm of skin of left upper limb, including shoulder: Secondary | ICD-10-CM | POA: Diagnosis not present

## 2023-12-07 DIAGNOSIS — D1722 Benign lipomatous neoplasm of skin and subcutaneous tissue of left arm: Secondary | ICD-10-CM | POA: Diagnosis not present

## 2023-12-07 DIAGNOSIS — D2371 Other benign neoplasm of skin of right lower limb, including hip: Secondary | ICD-10-CM | POA: Diagnosis not present

## 2023-12-07 DIAGNOSIS — D1801 Hemangioma of skin and subcutaneous tissue: Secondary | ICD-10-CM | POA: Diagnosis not present

## 2023-12-07 DIAGNOSIS — L603 Nail dystrophy: Secondary | ICD-10-CM | POA: Diagnosis not present

## 2023-12-07 DIAGNOSIS — L821 Other seborrheic keratosis: Secondary | ICD-10-CM | POA: Diagnosis not present

## 2023-12-07 DIAGNOSIS — D2262 Melanocytic nevi of left upper limb, including shoulder: Secondary | ICD-10-CM | POA: Diagnosis not present

## 2023-12-07 DIAGNOSIS — D692 Other nonthrombocytopenic purpura: Secondary | ICD-10-CM | POA: Diagnosis not present

## 2024-01-17 ENCOUNTER — Ambulatory Visit (HOSPITAL_BASED_OUTPATIENT_CLINIC_OR_DEPARTMENT_OTHER): Payer: HMO

## 2024-01-17 DIAGNOSIS — C61 Malignant neoplasm of prostate: Secondary | ICD-10-CM | POA: Diagnosis not present

## 2024-01-20 ENCOUNTER — Encounter: Payer: Self-pay | Admitting: *Deleted

## 2024-02-02 ENCOUNTER — Ambulatory Visit (HOSPITAL_BASED_OUTPATIENT_CLINIC_OR_DEPARTMENT_OTHER)
Admission: RE | Admit: 2024-02-02 | Discharge: 2024-02-02 | Disposition: A | Discharge status: Home / Self Care | Source: Ambulatory Visit | Attending: Cardiology | Admitting: Cardiology

## 2024-02-02 DIAGNOSIS — R911 Solitary pulmonary nodule: Secondary | ICD-10-CM | POA: Insufficient documentation

## 2024-02-02 DIAGNOSIS — R918 Other nonspecific abnormal finding of lung field: Secondary | ICD-10-CM | POA: Diagnosis not present

## 2024-02-06 ENCOUNTER — Ambulatory Visit: Payer: Self-pay | Admitting: Cardiology

## 2024-02-28 ENCOUNTER — Encounter: Admitting: Family Medicine

## 2024-02-28 ENCOUNTER — Telehealth: Payer: Self-pay

## 2024-02-28 DIAGNOSIS — I10 Essential (primary) hypertension: Secondary | ICD-10-CM

## 2024-02-28 DIAGNOSIS — E782 Mixed hyperlipidemia: Secondary | ICD-10-CM

## 2024-02-28 DIAGNOSIS — E038 Other specified hypothyroidism: Secondary | ICD-10-CM

## 2024-02-28 NOTE — Assessment & Plan Note (Deleted)
 Longstanding mildly elevated TSH with normal T4. TPO antibodies positive, indicative of Hashimoto's thyroiditis. We will continue to monitor this off of levothyroxine for now.

## 2024-02-28 NOTE — Telephone Encounter (Signed)
 Patient has re-scheduled his physical for 04/11/24 and would like to get labs done the week prior.  Can you place the orders and then I can let him know. Thanks.  Dm/cma

## 2024-02-28 NOTE — Assessment & Plan Note (Deleted)
 Blood pressure is now at goal. Continue losartan 100 mg daily.

## 2024-02-28 NOTE — Progress Notes (Incomplete)
 [facility] [address] [place] [address] Dept: [phone] Dept Fax: [phone]  Annual Physical Visit  Subjective:    Patient ID: Zachary Duncan, male    DOB: 1950-07-14, 73 y.o..   MRN: [id number]  No chief complaint on file.  History of Present Illness:  Patient is in today for an annual physical/preventative visit.  Zachary Duncan has a history of hypertension. He is managed on losartan  100 mg daily.    Zachary Duncan has a history of dyslipidemia. He had a coronary calcium  scan in Nov. I had started him on atorvastatin  20 mg daily, and Cardiology increased this to 80 mg daily.   Zachary Duncan has a long-term history of a mildly elevated TSH. He has seen Zachary Duncan. As he had some chest symptoms with taking levothyroxine  int he past, she has decided to monitor this off of any thyroid  hormone replacement at this point.   Zachary Duncan has been diagnosed with prostate cancer. He is now being seen at [facility].   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   Pulmonary nodules 02/28/2023   Elevated coronary artery calcium  score 02/16/2023   Essential hypertension 01/08/2023   Blurry vision, left eye 01/08/2023   Elevated PSA 02/02/2019   Subclinical hypothyroidism 02/02/2019   Excessive cerumen in both ear canals 01/23/2019   History of UTI 01/23/2019   Hyperlipidemia    Finger joint swelling 10/16/2010   Dermatophytosis of nail 05/10/2007   Past Surgical History:  Procedure Laterality Date   COLONOSCOPY W/ BIOPSIES AND POLYPECTOMY     Family History  Problem Relation Age of Onset   Heart disease Mother 42   Colon polyps Father    Heart disease Father 39   Heart disease Brother    Heart disease Brother 70   Diabetes Maternal  Grandfather    Colon cancer Neg Hx    Thyroid  disease Neg Hx    Crohn's disease Neg Hx    Esophageal cancer Neg Hx    Rectal cancer Neg Hx    Stomach cancer Neg Hx    Ulcerative colitis Neg Hx    Outpatient Medications Prior to Visit  Medication Sig Dispense Refill   aspirin  EC 81 MG tablet Take 1 tablet (81 mg total) by mouth daily. Swallow whole.     atorvastatin  (LIPITOR) 80 MG tablet Take 1 tablet (80 mg total) by mouth daily. 90 tablet 3   losartan  (COZAAR ) 100 MG tablet Take 1 tablet (100 mg total) by mouth daily. 90 tablet 3   No facility-administered medications prior to visit.   Allergies[1] Objective:   There were no vitals filed for this visit. There is no height or weight on file to calculate BMI.   General: Well developed, well nourished. No acute distress. HEENT: Normocephalic, non-traumatic. PERRL, EOMI. Conjunctiva clear. External ears normal. EAC and TMs normal bilaterally. Nose    clear without congestion or rhinorrhea. Mucous membranes moist. Oropharynx clear. Good dentition. Neck: Supple. No lymphadenopathy. No thyromegaly. Lungs: Clear to auscultation bilaterally. No wheezing, rales or rhonchi. CV: RRR without murmurs or rubs. Pulses 2+ bilaterally. Abdomen: Soft, non-tender. Bowel sounds positive, normal pitch and frequency. No hepatosplenomegaly. No rebound or guarding. Back: Straight. No CVA tenderness bilaterally. Extremities: Full ROM. No joint swelling or tenderness. No edema  noted. Skin: Warm and dry. No rashes. Neuro: CN II-XII intact. Normal sensation and DTR bilaterally. Psych: Alert and oriented. Normal mood and affect.  Health Maintenance Due  Topic Date Due   Medicare Annual Wellness (AWV)  Never done   DTaP/Tdap/Td (1 - Tdap) Never done   Influenza Vaccine  10/15/2023    Imaging  CT Chest Wo Contrast Result Date: 02/06/2024 IMPRESSION: 1. Stable subcentimeter pulmonary nodules without interval growth; largest noncalcified nodule  measures 3 mm, with no new or enlarging nodules. No additional imaging follow-up is indicated in a low-risk patient per fleischner society guidelines.   Lab Results {Labs (Optional):29002}  Lab Results  Component Value Date   TSH 7.91 (H) 11/22/2023    Lipid Panel:  Lab Results  Component Value Date   CHOL 193 01/21/2023   HDL 41.40 01/21/2023   LDLCALC 123 (H) 01/21/2023   TRIG 140.0 01/21/2023   Assessment & Plan:   Problem List Items Addressed This Visit   None   No follow-ups on file.   Zachary CHRISTELLA Simpler, MD  I,Zachary Duncan,acting as a scribe for Zachary CHRISTELLA Simpler, MD.,have documented all relevant documentation on the behalf of Zachary CHRISTELLA Simpler, MD.  I, Zachary CHRISTELLA Simpler, MD, have reviewed all documentation for this visit. The documentation on 02/28/2024 for the exam, diagnosis, procedures, and orders are all accurate and complete.    [1] No Known Allergies

## 2024-02-28 NOTE — Assessment & Plan Note (Deleted)
 Continue atorvastatin 80 mg daily as recommended by cardiology.

## 2024-02-28 NOTE — Telephone Encounter (Signed)
 Copied from CRM [id number]. Topic: Appointments - Scheduling Inquiry for Clinic >> Feb 28, 2024 10:36 AM Deleta RAMAN wrote: Reason for CRM: patient would like to receive labs a week before physical please call regarding time

## 2024-02-28 NOTE — Addendum Note (Signed)
 Addended by: THEDORA GARNETTE HERO on: 02/28/2024 01:05 PM   Modules accepted: Orders

## 2024-03-01 ENCOUNTER — Encounter: Payer: Self-pay | Admitting: Family Medicine

## 2024-03-22 ENCOUNTER — Other Ambulatory Visit: Payer: Self-pay | Admitting: Family Medicine

## 2024-03-22 DIAGNOSIS — I10 Essential (primary) hypertension: Secondary | ICD-10-CM

## 2024-04-11 ENCOUNTER — Encounter: Admitting: Family Medicine

## 2024-05-03 ENCOUNTER — Ambulatory Visit: Admitting: Cardiology

## 2024-05-22 ENCOUNTER — Ambulatory Visit: Admitting: Internal Medicine
